# Patient Record
Sex: Female | Born: 1970 | Race: White | Hispanic: No | Marital: Married | Smoking: Former smoker
Health system: Southern US, Community
[De-identification: ages and names within clinical notes are randomized; demographics above are authoritative.]

## PROBLEM LIST (undated history)

## (undated) ENCOUNTER — Ambulatory Visit: Admission: EM

## (undated) DIAGNOSIS — F419 Anxiety disorder, unspecified: Secondary | ICD-10-CM

## (undated) HISTORY — PX: KNEE SURGERY: SHX244

## (undated) HISTORY — DX: Anxiety disorder, unspecified: F41.9

## (undated) HISTORY — PX: ABDOMINAL HYSTERECTOMY: SHX81

---

## 2013-03-17 ENCOUNTER — Ambulatory Visit: Payer: Self-pay | Admitting: Internal Medicine

## 2014-06-24 ENCOUNTER — Ambulatory Visit
Admit: 2014-06-24 | Disposition: A | Discharge status: Home / Self Care | Payer: Self-pay | Attending: Gastroenterology | Admitting: Gastroenterology

## 2014-06-24 LAB — HCG, QUANTITATIVE, PREGNANCY: Beta Hcg, Quant.: <1 m[IU]/mL

## 2014-06-30 LAB — SURGICAL PATHOLOGY

## 2014-11-26 ENCOUNTER — Emergency Department
Admission: EM | Admit: 2014-11-26 | Discharge: 2014-11-26 | Disposition: A | Discharge status: Home / Self Care | Payer: BLUE CROSS/BLUE SHIELD | Attending: Emergency Medicine | Admitting: Emergency Medicine

## 2014-11-26 ENCOUNTER — Emergency Department: Payer: BLUE CROSS/BLUE SHIELD

## 2014-11-26 DIAGNOSIS — R110 Nausea: Secondary | ICD-10-CM | POA: Diagnosis not present

## 2014-11-26 DIAGNOSIS — R1011 Right upper quadrant pain: Secondary | ICD-10-CM | POA: Diagnosis present

## 2014-11-26 DIAGNOSIS — R109 Unspecified abdominal pain: Secondary | ICD-10-CM

## 2014-11-26 LAB — URINALYSIS COMPLETE WITH MICROSCOPIC (ARMC ONLY)
BILIRUBIN URINE: NEGATIVE
GLUCOSE, UA: NEGATIVE mg/dL
Hgb urine dipstick: NEGATIVE
Ketones, ur: NEGATIVE mg/dL
Leukocytes, UA: NEGATIVE
Nitrite: NEGATIVE
PH: 5 (ref 5.0–8.0)
Protein, ur: NEGATIVE mg/dL
RBC / HPF: NONE SEEN RBC/hpf (ref 0–5)
Specific Gravity, Urine: 1.02 (ref 1.005–1.030)

## 2014-11-26 LAB — LIPASE, BLOOD: Lipase: 33 U/L (ref 22–51)

## 2014-11-26 LAB — COMPREHENSIVE METABOLIC PANEL
ALK PHOS: 72 U/L (ref 38–126)
ALT: 22 U/L (ref 14–54)
AST: 23 U/L (ref 15–41)
Albumin: 4 g/dL (ref 3.5–5.0)
Anion gap: 9 (ref 5–15)
BUN: 18 mg/dL (ref 6–20)
CHLORIDE: 108 mmol/L (ref 101–111)
CO2: 23 mmol/L (ref 22–32)
Calcium: 9 mg/dL (ref 8.9–10.3)
Creatinine, Ser: 0.76 mg/dL (ref 0.44–1.00)
GFR calc Af Amer: >60 mL/min (ref >60)
GFR calc non Af Amer: >60 mL/min (ref >60)
GLUCOSE: 83 mg/dL (ref 65–99)
Potassium: 3.8 mmol/L (ref 3.5–5.1)
Sodium: 140 mmol/L (ref 135–145)
Total Bilirubin: 0.5 mg/dL (ref 0.3–1.2)
Total Protein: 6.8 g/dL (ref 6.5–8.1)

## 2014-11-26 LAB — CBC
HCT: 39.7 % (ref 35.0–47.0)
Hemoglobin: 12.7 g/dL (ref 12.0–16.0)
MCH: 28.9 pg (ref 26.0–34.0)
MCHC: 32 g/dL (ref 32.0–36.0)
MCV: 90.3 fL (ref 80.0–100.0)
Platelets: 219 10*3/uL (ref 150–440)
RBC: 4.39 MIL/uL (ref 3.80–5.20)
RDW: 13.4 % (ref 11.5–14.5)
WBC: 12.1 10*3/uL — ABNORMAL HIGH (ref 3.6–11.0)

## 2014-11-26 MED ORDER — SODIUM CHLORIDE 0.9 % IV BOLUS (SEPSIS)
1000.0000 mL | Freq: Once | INTRAVENOUS | Status: AC
  Administered 2014-11-26: 1000 mL via INTRAVENOUS

## 2014-11-26 MED ORDER — ONDANSETRON HCL 4 MG/2ML IJ SOLN
4.0000 mg | Freq: Once | INTRAMUSCULAR | Status: AC
  Administered 2014-11-26: 4 mg via INTRAVENOUS

## 2014-11-26 MED ORDER — MORPHINE SULFATE (PF) 4 MG/ML IV SOLN
4.0000 mg | Freq: Once | INTRAVENOUS | Status: AC
  Administered 2014-11-26: 4 mg via INTRAVENOUS
  Filled 2014-11-26: qty 1

## 2014-11-26 MED ORDER — OXYCODONE-ACETAMINOPHEN 5-325 MG PO TABS: 1.0000 | ORAL_TABLET | Freq: Four times a day (QID) | ORAL | Status: AC | PRN

## 2014-11-26 MED ORDER — MORPHINE SULFATE (PF) 4 MG/ML IV SOLN
INTRAVENOUS | Status: AC
  Administered 2014-11-26: 4 mg via INTRAVENOUS
  Filled 2014-11-26: qty 1

## 2014-11-26 MED ORDER — ONDANSETRON HCL 4 MG PO TABS: 4.0000 mg | ORAL_TABLET | Freq: Every day | ORAL | Status: AC | PRN

## 2014-11-26 MED ORDER — MORPHINE SULFATE (PF) 4 MG/ML IV SOLN
4.0000 mg | Freq: Once | INTRAVENOUS | Status: AC
  Administered 2014-11-26: 4 mg via INTRAVENOUS

## 2014-11-26 MED ORDER — ONDANSETRON HCL 4 MG/2ML IJ SOLN
INTRAMUSCULAR | Status: AC
  Administered 2014-11-26: 4 mg via INTRAVENOUS
  Filled 2014-11-26: qty 2

## 2014-11-26 NOTE — Discharge Instructions (Signed)
Flank Pain °Flank pain refers to pain that is located on the side of the body between the upper abdomen and the back. The pain may occur over a short period of time (acute) or may be long-term or reoccurring (chronic). It may be mild or severe. Flank pain can be caused by many things. °CAUSES  °Some of the more common causes of flank pain include: °· Muscle strains.   °· Muscle spasms.   °· A disease of your spine (vertebral disk disease).   °· A lung infection (pneumonia).   °· Fluid around your lungs (pulmonary edema).   °· A kidney infection.   °· Kidney stones.   °· A very painful skin rash caused by the chickenpox virus (shingles).   °· Gallbladder disease.   °HOME CARE INSTRUCTIONS  °Home care will depend on the cause of your pain. In general, °· Rest as directed by your caregiver. °· Drink enough fluids to keep your urine clear or pale yellow. °· Only take over-the-counter or prescription medicines as directed by your caregiver. Some medicines may help relieve the pain. °· Tell your caregiver about any changes in your pain. °· Follow up with your caregiver as directed. °SEEK IMMEDIATE MEDICAL CARE IF:  °· Your pain is not controlled with medicine.   °· You have new or worsening symptoms. °· Your pain increases.   °· You have abdominal pain.   °· You have shortness of breath.   °· You have persistent nausea or vomiting.   °· You have swelling in your abdomen.   °· You feel faint or pass out.   °· You have blood in your urine. °· You have a fever or persistent symptoms for more than 2-3 days. °· You have a fever and your symptoms suddenly get worse. °MAKE SURE YOU:  °· Understand these instructions. °· Will watch your condition. °· Will get help right away if you are not doing well or get worse. °Document Released: 04/14/2005 Document Revised: 11/16/2011 Document Reviewed: 10/06/2011 °ExitCare® Patient Information ©2015 [employer]. This information is not intended to replace advice given to you by your  health care provider. Make sure you discuss any questions you have with your health care provider. ° °

## 2014-11-26 NOTE — ED Provider Notes (Signed)
[facility] Emergency Department Provider Note  ____________________________________________  Time seen: Approximately 638 AM  I have reviewed the triage vital signs and the nursing notes.   HISTORY  Chief Complaint Abdominal Pain    HPI Tresa Jolley Stump is a 44 y.o. female who comes into the hospital with severe abdominal pain. The patient reports that she woke up with RUQ pain with radiation to her back. The patient reports that she had radiation to her back from her right upper quadrant and some right sided shoulder pain. The patient reports that she had a similar pain 1 month ago but it did not last very long. The patient denies any fever. She has had some chest pain but feels that it is more in her shoulder. The patient denies any other pain or any problems with urination. Her pain is 10/10 in intensity   No past medical history   There are no active problems to display for this patient.   past surgical history  BTL Knee surgery  No current outpatient prescriptions on file.  Allergies Review of patient's allergies indicates no known allergies.  No family history on file.  Social History Social History  Substance Use Topics  . Smoking status: Not on file  . Smokeless tobacco: Not on file  . Alcohol Use: Not on file    Review of Systems Constitutional: No fever/chills Eyes: No visual changes. ENT: No sore throat. Cardiovascular: Denies chest pain. Respiratory: Denies shortness of breath. Gastrointestinal:  abdominal pain and nausea, no vomiting.  No constipation. Genitourinary: Negative for dysuria. Musculoskeletal: Negative for back pain. Skin: Negative for rash. Neurological: Negative for headaches, focal weakness or numbness.  10-point ROS otherwise negative.  ____________________________________________   PHYSICAL EXAM:  VITAL SIGNS: ED Triage Vitals  Enc Vitals Group     BP 11/26/14 0622 134/83 mmHg     Pulse Rate 11/26/14  0622 92     Resp 11/26/14 0622 18     Temp 11/26/14 0622 97.5 F (36.4 C)     Temp Source 11/26/14 0622 Oral     SpO2 11/26/14 0622 100 %     Weight 11/26/14 0622 160 lb (72.576 kg)     Height 11/26/14 0622  (1.626 m)     Head Cir --      Peak Flow --      Pain Score 11/26/14 0622 10     Pain Loc --      Pain Edu? --      Excl. in GC? --     Constitutional: Alert and oriented. Well appearing and in moderate distress. Eyes: Conjunctivae are normal. PERRL. EOMI. Head: Atraumatic. Nose: No congestion/rhinnorhea. Mouth/Throat: Mucous membranes are moist.  Oropharynx non-erythematous. Cardiovascular: Normal rate, regular rhythm. Grossly normal heart sounds.  Good peripheral circulation. Respiratory: Normal respiratory effort.  No retractions. Lungs CTAB. Gastrointestinal: Soft with right upper quadrant tenderness to palpation. No distention. Positive bowel sounds Musculoskeletal: No lower extremity tenderness nor edema.  Neurologic:  Normal speech and language.  Skin:  Skin is warm, dry and intact.  Psychiatric: Mood and affect are normal.   ____________________________________________   LABS (all labs ordered are listed, but only abnormal results are displayed)  Labs Reviewed - No data to display ____________________________________________  EKG  ED ECG REPORT I, Rebecka Apley, the attending physician, personally viewed and interpreted this ECG.   Date: 11/26/2014  EKG Time: 636  Rate: 86  Rhythm: normal sinus rhythm  Axis: normal  Intervals:none  ST&T Change: none  ____________________________________________  RADIOLOGY  [place] Abd ____________________________________________   PROCEDURES  Procedure(s) performed: None  Critical Care performed: No  ____________________________________________   INITIAL IMPRESSION / ASSESSMENT AND PLAN / ED COURSE  Pertinent labs & imaging results that were available during my care of the patient were reviewed by  me and considered in my medical decision making (see chart for details).  This is a 44 year old female who woke up with acute onset of right upper quadrant pain with radiation to her right shoulder. The patient is nauseous with no vomiting but reports her pain as a 10 out of 10. I'll give the patient liter of normal saline, some morphine and Zofran and continue to reassess her. I will await the results of the ultrasound to determine the cause of the patient's pain.  The patient's care will be signed out to Dr. Mayford Knife will follow-up the results of the ultrasound to determine the patient's cause of pain. ____________________________________________   FINAL CLINICAL IMPRESSION(S) / ED DIAGNOSES  Final diagnoses:  RUQ pain      Rebecka Apley, MD 11/26/14 0840

## 2014-11-26 NOTE — ED Provider Notes (Signed)
Patient is a normal ultrasound but persistent right flank pain. We'll obtain renal protocol CT give additional IV morphine for pain.  Emily Filbert, MD 11/26/14 [phone]

## 2014-11-26 NOTE — ED Provider Notes (Signed)
  IMPRESSION: Mild right renal pelviectasis without frank hydronephrosis. No obstructing renal or ureteral stone. This may be due to a recently passed stone. Another possibility would be cystitis with ascending ureteral infection. Recommend correlation with urinalysis to help distinguish between these 2 possibilities.  Remainder of the abdomen and pelvis CT is unremarkable. No evidence of appendicitis. No significant free fluid. No bowel obstruction or evidence of bowel wall inflammation. Fairly large amount of stool throughout the nondistended colon (constipation?).   possible recently passed kidney stone. We'll send urine culture as well although the urine looks unremarkable. She'll be discharged with pain medicine and encouraged close follow-up with her doctor.  Emily Filbert, MD 11/26/14 1005

## 2014-11-26 NOTE — ED Notes (Signed)
Pt in with right sided abd pain, denies any n,v,d.

## 2014-11-28 LAB — URINE CULTURE
Culture: 40000
Special Requests: NORMAL

## 2017-05-18 ENCOUNTER — Ambulatory Visit: Payer: BLUE CROSS/BLUE SHIELD | Admitting: Physical Therapy

## 2017-05-22 ENCOUNTER — Encounter: Payer: Self-pay | Admitting: Physical Therapy

## 2017-05-22 ENCOUNTER — Other Ambulatory Visit: Payer: Self-pay

## 2017-05-22 ENCOUNTER — Ambulatory Visit: Payer: BLUE CROSS/BLUE SHIELD | Attending: Gastroenterology | Admitting: Physical Therapy

## 2017-05-22 DIAGNOSIS — R29898 Other symptoms and signs involving the musculoskeletal system: Secondary | ICD-10-CM | POA: Diagnosis present

## 2017-05-22 DIAGNOSIS — R2689 Other abnormalities of gait and mobility: Secondary | ICD-10-CM | POA: Diagnosis present

## 2017-05-22 DIAGNOSIS — M791 Myalgia, unspecified site: Secondary | ICD-10-CM | POA: Diagnosis not present

## 2017-05-22 DIAGNOSIS — M6281 Muscle weakness (generalized): Secondary | ICD-10-CM | POA: Diagnosis not present

## 2017-05-23 NOTE — Therapy (Addendum)
[facility] MAIN [facility] SERVICES [address] [place], [place], [address] Phone: [phone]   Fax:  [phone]  Physical Therapy Evaluation  Patient Details  Name: Nancy Paul MRN: [id number] Date of Birth: [birth year] Referring Provider: Trellis Paganini, PA   Encounter Date: 05/22/2017    Past Medical History:  Diagnosis Date  . Anxiety     History reviewed. No pertinent surgical history.  There were no vitals filed for this visit.   Subjective Assessment - 05/22/17 1511    Subjective  1)bowel issues:  Pt has had constipation for over 2 years. Bowel movements occur once a week. [place] Stool Type 3-4 occur 75%, Type 6-7 occur about in spells. Pt has been Dx with IBS 10 years ago after the delivery of her second child with a perineal tear "from hole to  hole". First her first child, she had small tear.  Denied constipation during childhood.  Fecal incontinence occurs after being constipated.   Pt feels she has not completely emptied and has tried enemas as recommended by her GI provider and she has discovered there is alot left everytime.  Pt has not used an enema for a couple weeks.  Pt reports she has to strain.  Pt reports she has alot of anxiety around this issue.  Pt has soiled her undergarments but she does not wear pads.    2) urinary leakage: pt notices dribbling after she stops urination. Pt has tried kegels midstream because she was told to strengthen her muscles after she had her hsysterectomy. Pt no longer does these exercises but she did find it to help.  Denied urge incontinence.  The leakage occurs after urination and not prior to getting there    3) CLBP occurred after her hysterectomy 2 years due to endometriosis, fibroids, and precancerous cells. Pt does not live in pain now. Pt had an emergency surgery 8 weeks after her hysterectomy because she had excuriating pain. She was told that the surgerical site did not close completely  around her cervix which caused her intestines to come through. The surgery resolve pain.  CLBP is achey pain in the lower back, without radiating pain.  Pt tried to start exercising a few weeks with the elliptical machine but had increased back pain. Pt has not gone back tot he gym since.     4) pelvic and lowback pain after sexual intercourse and with pelvic exams.    5)  sometimes numbness in both arms L > R upon waking since 2018-current.  Tenderness to touch on upper arms with welty type markings all year around. These are areas where she feels numb.     Pertinent History  Pt stopped taking her anxiety medication 2 months ago. Pt has not let her MD know but will be telling her on 06/01/17 at her physical appt. Stressors: gaining 32 lbs after hysterectomy,  Pt has worked with her PCP Dr. Shayne Alken for weight loss.  Pt used to work out 3-4x week, cardio, weights. Pt has stopped doing sti ups and crunches since hysterectomy.       Patient Stated Goals  Pt would like to feel good, regain energy level, and live happy, and be in shape again with a stronger core.          [facility] PT Assessment - 06/01/17 1714      Assessment   Medical Diagnosis  fecal incontinence    Referring Provider  Trellis Paganini, PA  Precautions   Precautions  None      Restrictions   Weight Bearing Restrictions  No      Balance Screen   Has the patient fallen in the past 6 months  Yes    How many times?  1    Has the patient had a decrease in activity level because of a fear of falling?   No    Is the patient reluctant to leave their home because of a fear of falling?   No      Observation/Other Assessments   Observations  crossed ankles in seated position       Coordination   Coordination and Movement Description  ab straining, cue for BM, pelvic floor contraction       Posture/Postural Control   Posture Comments  lumbopelvic perturbations w/ leg movements .       AROM   Overall AROM Comments  LBP reproduced w/  report of tightness in trunk rotaion full range       Palpation   Spinal mobility  increased upper trap/ thoracic hypomobility     Palpation comment  uper and lower abdomen all quadrants: non tender       Bed Mobility   Bed Mobility  -- crunching method, ab/ spine straining         Pelvic Floor Special Questions - 06/01/17 1715    External Perineal Exam  tenderness. tightness noted at upper and lower pelvic floor mm , palpation through clothing      Treatment: see pt instructions            PT Long Term Goals - 06/01/17 1711      PT LONG TERM GOAL #1   Title  Pt will decrease her COREFO score from 28% to <23 % in order to improve bowel movements and to not strain    Time  12    Period  Weeks    Status  New      PT LONG TERM GOAL #2   Title  Pt will decrease her PDI score from 31% to <26 %  in order to improve QOL     Time  8    Period  Weeks    Status  New      PT LONG TERM GOAL #3   Title  Pt will report decreased numbness in B arms by 50% and demo decreased upper trap mm tightness in order to progress to deep core coordination for improved function for ADLs    Time  6    Period  Weeks    Status  New      PT LONG TERM GOAL #4   Title  Pt will demo softer abdomen with palpation, improved mobility of deep core in order to progress to stability exercises for improved GI and pelvic floor function     Time  4    Period  Weeks    Status  New      PT LONG TERM GOAL #5   Title  Pt will decrease CLBP by 50% in order to progress towards fitness exercises     Time  6    Period  Weeks    Status  New      PT LONG TERM GOAL #6   Title  Pt will demo proper technique and alignment and voice understanding on principles of exercises in order to intergrate to gym setting with less risk for injuries    Time  12  Period  Weeks        Plan - 05/23/17 2254    Clinical Impression Statement  Pt is a 47 yo female who reports constipation,post-urine dribble, CLBP, and B  arm/hand numbness. These deficits impact her QOL and ADLs. Pt's clinical presentations include non-soft abdomen, increased thoracic hypomobility, increased upper neck/ shoulder, pelvic floor tightness, dyscoordination of her deep core mm, and poor body mechanics that place strain on her deep core system. Pt has had multiple surgeries over her abdomen including a hysterectomy 2 years due to endometriosis, fibroids, and precancerous cells and later, an emergency surgery 8 weeks after her hysterectomy due the surgerical site failing to close completely around her cervix.  Pt also had a perineal tears with both her deliveries. These are contributing factors to her Sx. Following Tx today, pt demo'd improved breathing coordination to not strain her pelvic floor. Plan to assess her pelvic floor and arms/shoulder  at upcoming sessions.    Rehab Potential  Good    PT Frequency  1x / week    PT Duration  12 weeks    PT Treatment/Interventions  Scar mobilization;Neuromuscular re-education;Balance training;Therapeutic exercise;Therapeutic activities;Manual lymph drainage;Patient/family education;Moist Heat;Traction;Manual techniques;Gait training;Taping;Energy conservation;Functional mobility training;Stair training    Consulted and Agree with Plan of Care  Patient       Patient will benefit from skilled therapeutic intervention in order to improve the following deficits and impairments:  Postural dysfunction, Increased muscle spasms, Hypermobility, Decreased scar mobility, Decreased mobility, Decreased coordination, Decreased endurance, Decreased activity tolerance, Decreased range of motion, Decreased strength, Hypomobility, Decreased safety awareness, Improper body mechanics, Pain, Increased fascial restricitons  Visit Diagnosis: Muscle weakness (generalized)  Other symptoms and signs involving the musculoskeletal system  Other abnormalities of gait and mobility     Problem List There are no active  problems to display for this patient.   Mariane MastersYeung,Shin Yiing ,PT, DPT, E-RYT  06/01/2017, 5:16 PM  [place] [facility] [address] [place], [place], [address] Phone: [phone]   Fax:  [phone]  Name: Nancy Paul MRN: [id number] Date of Birth: [birth year]

## 2017-06-01 ENCOUNTER — Ambulatory Visit: Payer: BLUE CROSS/BLUE SHIELD | Admitting: Physical Therapy

## 2017-06-01 DIAGNOSIS — R29898 Other symptoms and signs involving the musculoskeletal system: Secondary | ICD-10-CM

## 2017-06-01 DIAGNOSIS — M6281 Muscle weakness (generalized): Secondary | ICD-10-CM

## 2017-06-01 DIAGNOSIS — R2689 Other abnormalities of gait and mobility: Secondary | ICD-10-CM

## 2017-06-01 NOTE — Addendum Note (Signed)
Addended by: Mariane MastersYEUNG, SHIN-YIING on: 06/01/2017 05:22 PM   Modules accepted: Orders

## 2017-06-01 NOTE — Patient Instructions (Signed)
Open book exercise ( handout)   Gentle ROM at neck, shoulders, pelvic tilts, ankles   Deep core level 2    Restorative pose ( legs propped up ) after work 10-15 min  with towel over eyes, to calm down the senses. Relaxation to help with nervous system which will help with bowel and pelvic function

## 2017-06-01 NOTE — Therapy (Signed)
[place] [facility] [address] [place], [place], [address] Phone: [phone]   Fax:  [phone]  Physical Therapy Treatment  Patient Details  Name: Nancy Paul MRN: [id number] Date of Birth: 1970-03-24 Referring Provider: Trellis Paganinianielle Maier, PA   Encounter Date: 06/01/2017  PT End of Session - 06/01/17 1729    Visit Number  2    Number of Visits  12    Date for PT Re-Evaluation  08/15/17    PT Start Time  0810    PT Stop Time  0910    PT Time Calculation (min)  60 min       Past Medical History:  Diagnosis Date  . Anxiety     No past surgical history on file.  There were no vitals filed for this visit.  Subjective Assessment - 06/01/17 1727    Subjective  Pt reports she has been doing her exercises everyday    Pertinent History  Pt stopped taking her anxiety medication 2 months ago. Pt has not let her MD know but will be telling her on 06/01/17 at her physical appt. Stressors: gaining 32 lbs after hysterectomy,  Pt has worked with her PCP Dr. Shayne AlkenLam for weight loss.  Pt used to work out 3-4x week, cardio, weights. Pt has stopped doing sti ups and crunches since hysterectomy.       Patient Stated Goals  Pt would like to feel good, regain energy level, and live happy, and be in shape again with a stronger core.          [facility] - 06/01/17 1731      Palpation   Spinal mobility  increased mm tensions at medial scap, levator, upper trap   R > L, hypobility at thoracic spine.                [facility] Adult PT Treatment/Exercise - 06/01/17 1731      Neuro Re-ed    Neuro Re-ed Details   see pt instructions       Modalities   Modalities  Moist Heat      Moist Heat Therapy   Number Minutes Moist Heat  5 Minutes    Moist Heat Location  -- neck, shoulders, back       Manual Therapy   Manual therapy comments  STM at problem areas, Grade III at thoracic mobilityT3-12 , gentle abdominal massage                PT Education - 06/01/17 1729    Education provided  Yes    Education Details  HEP    Person(s) Educated  Patient    Methods  Explanation;Demonstration;Tactile cues;Verbal cues;Handout    Comprehension  Returned demonstration;Verbalized understanding          PT Long Term Goals - 06/01/17 1711      PT LONG TERM GOAL #1   Title  Pt will decrease her COREFO score from 28% to <23 % in order to improve bowel movements and to not strain    Time  12    Period  Weeks    Status  New      PT LONG TERM GOAL #2   Title  Pt will decrease her PDI score from 31% to <26 %  in order to improve QOL     Time  8    Period  Weeks    Status  New      PT LONG  TERM GOAL #3   Title  Pt will report decreased numbness in B arms by 50% and demo decreased upper trap mm tightness in order to progress to deep core coordination for improved function for ADLs    Time  6    Period  Weeks    Status  New      PT LONG TERM GOAL #4   Title  Pt will demo softer abdomen with palpation, improved mobility of deep core in order to progress to stability exercises for improved GI and pelvic floor function     Time  4    Period  Weeks    Status  New      PT LONG TERM GOAL #5   Title  Pt will decrease CLBP by 50% in order to progress towards fitness exercises     Time  6    Period  Weeks    Status  New      PT LONG TERM GOAL #6   Title  Pt will demo proper technique and alignment and voice understanding on principles of exercises in order to intergrate to gym setting with less risk for injuries    Time  12    Period  Weeks            Plan - 06/01/17 1729    Clinical Impression Statement  Pt showed decreased thoracic/ shoulder mm tensions postTx. Pt demo'd improved diaphragmatic excursion and pelvic floor relaxation. Pt was guided with relaxation practices and thoracic mobility HEP. Pt reported feeling lighter and appeared to be holding less tensions in her shoulders post Tx.  Pt continues  to benefit from skilled PT.    Rehab Potential  Good    PT Frequency  1x / week    PT Duration  12 weeks    PT Treatment/Interventions  Scar mobilization;Neuromuscular re-education;Balance training;Therapeutic exercise;Therapeutic activities;Manual lymph drainage;Patient/family education;Moist Heat;Traction;Manual techniques;Gait training;Taping;Energy conservation;Functional mobility training;Stair training    Consulted and Agree with Plan of Care  Patient       Patient will benefit from skilled therapeutic intervention in order to improve the following deficits and impairments:  Postural dysfunction, Increased muscle spasms, Hypermobility, Decreased scar mobility, Decreased mobility, Decreased coordination, Decreased endurance, Decreased activity tolerance, Decreased range of motion, Decreased strength, Hypomobility, Decreased safety awareness, Improper body mechanics, Pain, Increased fascial restricitons  Visit Diagnosis: Muscle weakness (generalized)  Other symptoms and signs involving the musculoskeletal system  Other abnormalities of gait and mobility     Problem List There are no active problems to display for this patient.   Mariane Masters ,PT, DPT, E-RYT  06/01/2017, 5:34 PM  [place] MAIN [facility] SERVICES [address] [place], [place], [address] Phone: [phone]   Fax:  [phone]  Name: Nancy Paul MRN: [id number] Date of Birth: [birth year]

## 2017-06-01 NOTE — Patient Instructions (Signed)
Proper breathing and toileting posture

## 2017-06-05 ENCOUNTER — Ambulatory Visit: Payer: BLUE CROSS/BLUE SHIELD | Admitting: Physical Therapy

## 2017-06-14 ENCOUNTER — Ambulatory Visit: Payer: BLUE CROSS/BLUE SHIELD | Attending: Gastroenterology | Admitting: Physical Therapy

## 2017-06-14 DIAGNOSIS — M6281 Muscle weakness (generalized): Secondary | ICD-10-CM | POA: Diagnosis not present

## 2017-06-14 DIAGNOSIS — R279 Unspecified lack of coordination: Secondary | ICD-10-CM | POA: Diagnosis present

## 2017-06-14 DIAGNOSIS — R29898 Other symptoms and signs involving the musculoskeletal system: Secondary | ICD-10-CM | POA: Insufficient documentation

## 2017-06-14 DIAGNOSIS — R2689 Other abnormalities of gait and mobility: Secondary | ICD-10-CM | POA: Diagnosis present

## 2017-06-14 NOTE — Therapy (Signed)
[facility] MAIN [facility] SERVICES [address] [place], [place], [address] Phone: [phone]   Fax:  [phone]  Physical Therapy Treatment  Patient Details  Name: Nancy Paul MRN: [id number] Date of Birth: 07-22-1970 Referring Provider: Trellis Paganini, PA   Encounter Date: 06/14/2017  PT End of Session - 06/14/17 1707    Visit Number  3    Number of Visits  12    Date for PT Re-Evaluation  08/15/17    PT Start Time  1600    PT Stop Time  1705    PT Time Calculation (min)  65 min       Past Medical History:  Diagnosis Date  . Anxiety     No past surgical history on file.  There were no vitals filed for this visit.  Subjective Assessment - 06/14/17 1603    Subjective  Pt has been doing her exercises.     Pertinent History  Pt stopped taking her anxiety medication 2 months ago. Pt has not let her MD know but will be telling her on 06/01/17 at her physical appt. Stressors: gaining 32 lbs after hysterectomy,  Pt has worked with her PCP Dr. Shayne Alken for weight loss.  Pt used to work out 3-4x week, cardio, weights. Pt has stopped doing sti ups and crunches since hysterectomy.       Patient Stated Goals  Pt would like to feel good, regain energy level, and live happy, and be in shape again with a stronger core.                     Pelvic Floor Special Questions - 06/14/17 1659    External Perineal Exam  tenderness/tightness R > L at coccyx/ bulbospongiosus, ischiocavernosus     Pelvic Floor Internal Exam  pt consented verbally without contraindications     Exam Type  Vaginal    Palpation  increased severely restricted perineal scar in 3rd layer 3 - 9 o'clock / iscial spine and obt int B          [facility] Adult PT Treatment/Exercise - 06/14/17 1703      Exercises   Exercises  -- see pt instructions       Manual Therapy   Manual therapy comments  MWM, gentle STM at probelm areas noted in assessment     Internal Pelvic Floor    3rd layer 3 - 9 o'clock , scar releases with MWM at distal ends             PT Education - 06/14/17 1707    Education provided  Yes    Education Details  HEP    Person(s) Educated  Patient    Methods  Explanation;Demonstration;Tactile cues;Verbal cues;Handout    Comprehension  Returned demonstration;Verbalized understanding          PT Long Term Goals - 06/01/17 1711      PT LONG TERM GOAL #1   Title  Pt will decrease her COREFO score from 28% to <23 % in order to improve bowel movements and to not strain    Time  12    Period  Weeks    Status  New      PT LONG TERM GOAL #2   Title  Pt will decrease her PDI score from 31% to <26 %  in order to improve QOL     Time  8    Period  Weeks    Status  New      PT LONG TERM GOAL #3   Title  Pt will report decreased numbness in B arms by 50% and demo decreased upper trap mm tightness in order to progress to deep core coordination for improved function for ADLs    Time  6    Period  Weeks    Status  New      PT LONG TERM GOAL #4   Title  Pt will demo softer abdomen with palpation, improved mobility of deep core in order to progress to stability exercises for improved GI and pelvic floor function     Time  4    Period  Weeks    Status  New      PT LONG TERM GOAL #5   Title  Pt will decrease CLBP by 50% in order to progress towards fitness exercises     Time  6    Period  Weeks    Status  New      PT LONG TERM GOAL #6   Title  Pt will demo proper technique and alignment and voice understanding on principles of exercises in order to intergrate to gym setting with less risk for injuries    Time  12    Period  Weeks            Plan - 06/14/17 1707    Clinical Impression Statement  Pt demo'd increased perineal scar restrictions in the 3rd layer of her pelvic floor mm.  Pt demo'd less scar restrictions at the distal ends of her scar post Tx. Pt also showed less mm tightness at superficial layers. Pt continues to  benefit from skilled PT.     Rehab Potential  Good    PT Frequency  1x / week    PT Duration  12 weeks    PT Treatment/Interventions  Scar mobilization;Neuromuscular re-education;Balance training;Therapeutic exercise;Therapeutic activities;Manual lymph drainage;Patient/family education;Moist Heat;Traction;Manual techniques;Gait training;Taping;Energy conservation;Functional mobility training;Stair training    Consulted and Agree with Plan of Care  Patient       Patient will benefit from skilled therapeutic intervention in order to improve the following deficits and impairments:  Postural dysfunction, Increased muscle spasms, Hypermobility, Decreased scar mobility, Decreased mobility, Decreased coordination, Decreased endurance, Decreased activity tolerance, Decreased range of motion, Decreased strength, Hypomobility, Decreased safety awareness, Improper body mechanics, Pain, Increased fascial restricitons  Visit Diagnosis: Muscle weakness (generalized)  Other abnormalities of gait and mobility  Other symptoms and signs involving the musculoskeletal system     Problem List There are no active problems to display for this patient.   Mariane MastersYeung,Shin Yiing ,PT, DPT, E-RYT  06/14/2017, 5:11 PM  [facility] [address] [place], [place], [address] Phone: [phone]   Fax:  [phone]  Name: Nancy Paul MRN: [id number] Date of Birth: 05-Nov-1970

## 2017-06-14 NOTE — Patient Instructions (Signed)
1) Pelvic tilts 10reps   2) Stretch for pelvic floor   "v heels slide away and then back toward buttocks and then rock knee to slight ,  slide heel along at 11 o clock away from buttocks   10 reps   3) happy baby with breathing and pelvic expansion awareness 5 breaths     4) child poses rocking    _____  At work:  Mini squats with hands motioning expansion of pelvic floor  10 reps   childs pose at the desk - 3 way   5 breaths    minisquat with -ankle over opposite , lengthen your back

## 2017-06-23 ENCOUNTER — Ambulatory Visit: Payer: BLUE CROSS/BLUE SHIELD | Admitting: Physical Therapy

## 2017-06-23 DIAGNOSIS — M6281 Muscle weakness (generalized): Secondary | ICD-10-CM | POA: Diagnosis not present

## 2017-06-23 DIAGNOSIS — R279 Unspecified lack of coordination: Secondary | ICD-10-CM

## 2017-06-23 DIAGNOSIS — R2689 Other abnormalities of gait and mobility: Secondary | ICD-10-CM

## 2017-06-23 DIAGNOSIS — R29898 Other symptoms and signs involving the musculoskeletal system: Secondary | ICD-10-CM

## 2017-06-23 NOTE — Patient Instructions (Signed)
   Standing:  10 reps on both sides x 3 x day     3 point tap   Feet are hip width Tap forward, center under hip not feet next to each other  Tap middle\, center  Tap back       _Figure-4 and then toe touch to the ground behind you along a diagonal   

## 2017-06-23 NOTE — Therapy (Signed)
[facility] MAIN [facility] SERVICES [address] [place], [place], [address] Phone: [phone]   Fax:  [phone]  Physical Therapy Treatment  Patient Details  Name: MONAE TOPPING MRN: [id number] Date of Birth: 09/03/1970 Referring Provider: Trellis Paganini, PA   Encounter Date: 06/23/2017  PT End of Session - 06/23/17 1248    Visit Number  4    Number of Visits  12    Date for PT Re-Evaluation  08/15/17    PT Start Time  0915    PT Stop Time  1005    PT Time Calculation (min)  50 min       Past Medical History:  Diagnosis Date  . Anxiety     No past surgical history on file.  There were no vitals filed for this visit.  Subjective Assessment - 06/23/17 0915    Subjective  Pt reported she had increased back pain after last session but after taking Alleve, it went away.     Pertinent History  Pt stopped taking her anxiety medication 2 months ago. Pt has not let her MD know but will be telling her on 06/01/17 at her physical appt. Stressors: gaining 32 lbs after hysterectomy,  Pt has worked with her PCP Dr. Shayne Alken for weight loss.  Pt used to work out 3-4x week, cardio, weights. Pt has stopped doing sti ups and crunches since hysterectomy.       Patient Stated Goals  Pt would like to feel good, regain energy level, and live happy, and be in shape again with a stronger core.                     Pelvic Floor Special Questions - 06/23/17 1006    External Perineal Exam  tenderness/tightness R > L ischioanal fossa with increased tenderness/ tightness      Pelvic Floor Internal Exam  pt consented verbally without contraindications     Exam Type  Vaginal    Palpation  increased severely restricted perineal scar in 3nd layer 4- 6 oclock, 7 o clock bulbospongious, ischioanal fossa                 PT Education - 06/23/17 1247    Education provided  Yes    Education Details  HEP    Person(s) Educated  Patient    Methods   Explanation;Demonstration;Tactile cues;Verbal cues;Handout    Comprehension  Returned demonstration;Verbalized understanding          PT Long Term Goals - 06/01/17 1711      PT LONG TERM GOAL #1   Title  Pt will decrease her COREFO score from 28% to <23 % in order to improve bowel movements and to not strain    Time  12    Period  Weeks    Status  New      PT LONG TERM GOAL #2   Title  Pt will decrease her PDI score from 31% to <26 %  in order to improve QOL     Time  8    Period  Weeks    Status  New      PT LONG TERM GOAL #3   Title  Pt will report decreased numbness in B arms by 50% and demo decreased upper trap mm tightness in order to progress to deep core coordination for improved function for ADLs    Time  6    Period  Weeks  Status  New      PT LONG TERM GOAL #4   Title  Pt will demo softer abdomen with palpation, improved mobility of deep core in order to progress to stability exercises for improved GI and pelvic floor function     Time  4    Period  Weeks    Status  New      PT LONG TERM GOAL #5   Title  Pt will decrease CLBP by 50% in order to progress towards fitness exercises     Time  6    Period  Weeks    Status  New      PT LONG TERM GOAL #6   Title  Pt will demo proper technique and alignment and voice understanding on principles of exercises in order to intergrate to gym setting with less risk for injuries    Time  12    Period  Weeks            Plan - 06/23/17 1238    Clinical Impression Statement  Pt tolerated perineal scar releases without complaints with internal and external perineal manual Tx. Pt showed increased tightness on R side of posterior pelvic floor which decreased post Tx. Anticipate that as pt achieves increased pelvic floor mobility, pt will have improved bowel function and less pelvic pain.  Pt continues to benefit from skilled PT.     Rehab Potential  Good    PT Frequency  1x / week    PT Duration  12 weeks    PT  Treatment/Interventions  Scar mobilization;Neuromuscular re-education;Balance training;Therapeutic exercise;Therapeutic activities;Manual lymph drainage;Patient/family education;Moist Heat;Traction;Manual techniques;Gait training;Taping;Energy conservation;Functional mobility training;Stair training    Consulted and Agree with Plan of Care  Patient       Patient will benefit from skilled therapeutic intervention in order to improve the following deficits and impairments:  Postural dysfunction, Increased muscle spasms, Hypermobility, Decreased scar mobility, Decreased mobility, Decreased coordination, Decreased endurance, Decreased activity tolerance, Decreased range of motion, Decreased strength, Hypomobility, Decreased safety awareness, Improper body mechanics, Pain, Increased fascial restricitons  Visit Diagnosis: Muscle weakness (generalized)  Unspecified lack of coordination  Other abnormalities of gait and mobility  Other symptoms and signs involving the musculoskeletal system     Problem List There are no active problems to display for this patient.   Mariane MastersYeung,Shin Yiing ,PT, DPT, E-RYT   06/23/2017, 12:50 PM  [place] [facility] [address] [place], [place], [address] Phone: [phone]   Fax:  [phone]  Name: Burke KeelsJoyce M Gurney MRN: [id number] Date of Birth: 1971/02/28

## 2017-06-28 ENCOUNTER — Ambulatory Visit: Payer: BLUE CROSS/BLUE SHIELD | Admitting: Physical Therapy

## 2017-06-28 DIAGNOSIS — R29898 Other symptoms and signs involving the musculoskeletal system: Secondary | ICD-10-CM

## 2017-06-28 DIAGNOSIS — R2689 Other abnormalities of gait and mobility: Secondary | ICD-10-CM

## 2017-06-28 DIAGNOSIS — M6281 Muscle weakness (generalized): Secondary | ICD-10-CM | POA: Diagnosis not present

## 2017-06-28 DIAGNOSIS — R279 Unspecified lack of coordination: Secondary | ICD-10-CM

## 2017-06-28 NOTE — Therapy (Signed)
[place] [facility] [address] [place], [place], [address] Phone: [phone]   Fax:  [phone]  Physical Therapy Treatment  Patient Details  Name: Nancy Paul MRN: [id number] Date of Birth: Jul 29, 1970 Referring Provider: Trellis Paganinianielle Maier, PA   Encounter Date: 06/28/2017  PT End of Session - 06/28/17 0954    Visit Number  5    Number of Visits  12    Date for PT Re-Evaluation  08/15/17    PT Start Time  0910    PT Stop Time  0950    PT Time Calculation (min)  40 min       Past Medical History:  Diagnosis Date  . Anxiety     No past surgical history on file.  There were no vitals filed for this visit.  Subjective Assessment - 06/28/17 0911    Subjective  Pt reported she feels she is not emptying completely with bowel movement. Pt gave herself an enema 2x since the past month    Pertinent History  Pt stopped taking her anxiety medication 2 months ago. Pt has not let her MD know but will be telling her on 06/01/17 at her physical appt. Stressors: gaining 32 lbs after hysterectomy,  Pt has worked with her PCP Dr. Shayne Paul for weight loss.  Pt used to work out 3-4x week, cardio, weights. Pt has stopped doing sti ups and crunches since hysterectomy.       Patient Stated Goals  Pt would like to feel good, regain energy level, and live happy, and be in shape again with a stronger core.          [facility] - 06/28/17 0953      Coordination   Gross Motor Movements are Fluid and Coordinated  -- demo'd quick release of pelvic floor w/o cues                Pelvic Floor Special Questions - 06/28/17 0953    Pelvic Floor Internal Exam  pt consented verbally without contraindications     Exam Type  Rectal    Palpation  increased severely restrictions at 12 o clock at EAS to puborectalis anterior ( decreased post Tx)                      PT Long Term Goals - 06/28/17 0912      PT LONG TERM GOAL #1    Title  Pt will decrease her COREFO score from 28% to <23 % in order to improve bowel movements and to not strain    Time  12    Period  Weeks    Status  On-going      PT LONG TERM GOAL #2   Title  Pt will decrease her PDI score from 31% to <26 %  in order to improve QOL     Time  8    Period  Weeks    Status  On-going      PT LONG TERM GOAL #3   Title  Pt will report decreased numbness in B arms by 50% and demo decreased upper trap mm tightness in order to progress to deep core coordination for improved function for ADLs    Time  6    Period  Weeks    Status  On-going      PT LONG TERM GOAL #4   Title  Pt will demo softer abdomen with palpation, improved mobility of  deep core in order to progress to stability exercises for improved GI and pelvic floor function     Time  4    Period  Weeks    Status  On-going      PT LONG TERM GOAL #5   Title  Pt will decrease CLBP by 50% in order to progress towards fitness exercises     Time  6    Period  Weeks    Status  On-going      Additional Long Term Goals   Additional Long Term Goals  Yes      PT LONG TERM GOAL #6   Title  Pt will demo proper technique and alignment and voice understanding on principles of exercises in order to intergrate to gym setting with less risk for injuries    Time  12    Period  Weeks      PT LONG TERM GOAL #7   Title  Pt will decrease use of enema from 2 x month to < 1 x month in order to improve QOL    Time  8    Period  Weeks    Status  New    Target Date  08/23/17            Plan - 06/28/17 0954    Clinical Impression Statement  Addressed posterior pelvic floor tightness through intrarectal Tx. Pt demo'd pelvic tilt without difficulty and was educated on applying this tilt when sitting at work and on [employer] for better elimination.Also initiated pain science with sexual function of pelvic floor. Pt shows good carry over with quickly relaxing pelvic floor. Pt continues to benefit from skilled  PT.      Rehab Potential  Good    PT Frequency  1x / week    PT Duration  12 weeks    PT Treatment/Interventions  Scar mobilization;Neuromuscular re-education;Balance training;Therapeutic exercise;Therapeutic activities;Manual lymph drainage;Patient/family education;Moist Heat;Traction;Manual techniques;Gait training;Taping;Energy conservation;Functional mobility training;Stair training    Consulted and Agree with Plan of Care  Patient       Patient will benefit from skilled therapeutic intervention in order to improve the following deficits and impairments:  Postural dysfunction, Increased muscle spasms, Hypermobility, Decreased scar mobility, Decreased mobility, Decreased coordination, Decreased endurance, Decreased activity tolerance, Decreased range of motion, Decreased strength, Hypomobility, Decreased safety awareness, Improper body mechanics, Pain, Increased fascial restricitons  Visit Diagnosis: Unspecified lack of coordination  Muscle weakness (generalized)  Other abnormalities of gait and mobility  Other symptoms and signs involving the musculoskeletal system     Problem List There are no active problems to display for this patient.   Nancy Paul ,PT, DPT, E-RYT  06/28/2017, 9:56 AM  [facility] MAIN [facility] SERVICES [address] [place], [place], [address] Phone: [phone]   Fax:  [phone]  Name: Nancy Paul MRN: [id number] Date of Birth: 04/30/1970

## 2017-07-12 ENCOUNTER — Ambulatory Visit: Payer: BLUE CROSS/BLUE SHIELD | Attending: Gastroenterology | Admitting: Physical Therapy

## 2017-07-12 DIAGNOSIS — R2689 Other abnormalities of gait and mobility: Secondary | ICD-10-CM | POA: Insufficient documentation

## 2017-07-12 DIAGNOSIS — R29898 Other symptoms and signs involving the musculoskeletal system: Secondary | ICD-10-CM

## 2017-07-12 DIAGNOSIS — M6281 Muscle weakness (generalized): Secondary | ICD-10-CM

## 2017-07-12 DIAGNOSIS — M791 Myalgia, unspecified site: Secondary | ICD-10-CM | POA: Diagnosis not present

## 2017-07-12 NOTE — Therapy (Signed)
[facility] MAIN [facility] SERVICES [address] [place], [place], [address] Phone: [phone]   Fax:  [phone]  Physical Therapy Treatment  Patient Details  Name: Nancy Paul MRN: [id number] Date of Birth: 11/28/1970 Referring Provider: Trellis Paganini, PA   Encounter Date: 07/12/2017  PT End of Session - 07/12/17 0921    Visit Number  6    Number of Visits  12    Date for PT Re-Evaluation  08/15/17    PT Start Time  0914    PT Stop Time  1007    PT Time Calculation (min)  53 min       Past Medical History:  Diagnosis Date  . Anxiety     No past surgical history on file.  There were no vitals filed for this visit.  Subjective Assessment - 07/12/17 0917    Subjective  Pt reported that after last session, her stomach started churning and she had complete eliminating of bowels without straining. Since then, pt has had bowel movements where she still feels there is more to eliminate.  Pt's coworkers have complimented her on how she looks more relaxed. Pt does not feel she is as full in the middle of her stomach when she eats now.      Pertinent History  Pt stopped taking her anxiety medication 2 months ago. Pt has not let her MD know but will be telling her on 06/01/17 at her physical appt. Stressors: gaining 32 lbs after hysterectomy,  Pt has worked with her PCP Dr. Shayne Alken for weight loss.  Pt used to work out 3-4x week, cardio, weights. Pt has stopped doing sti ups and crunches since hysterectomy.       Patient Stated Goals  Pt would like to feel good, regain energy level, and live happy, and be in shape again with a stronger core.                     Pelvic Floor Special Questions - 07/12/17 0001    External Perineal Exam  L > R tensions and flinching tenderness at ischioanal fossa at the ischial tuberosity     Pelvic Floor Internal Exam  pt consented verbally without contraindications     Exam Type  Vaginal    Palpation   increased severely ischial spine, 2nd layer 3, 9 o clock         [facility] Adult PT Treatment/Exercise - 07/12/17 0945      Neuro Re-ed    Neuro Re-ed Details   see pt instructions       Manual Therapy   Manual therapy comments  STM at problem areas, Grade III at thoracic mobilityT3-12 , gentle abdominal massage      Internal Pelvic Floor   2nd layer 3 - 9 o'clock , scar releases with MWM at distal ends                  PT Long Term Goals - 06/28/17 0912      PT LONG TERM GOAL #1   Title  Pt will decrease her COREFO score from 28% to <23 % in order to improve bowel movements and to not strain    Time  12    Period  Weeks    Status  On-going      PT LONG TERM GOAL #2   Title  Pt will decrease her PDI score from 31% to <26 %  in order to improve  QOL     Time  8    Period  Weeks    Status  On-going      PT LONG TERM GOAL #3   Title  Pt will report decreased numbness in B arms by 50% and demo decreased upper trap mm tightness in order to progress to deep core coordination for improved function for ADLs    Time  6    Period  Weeks    Status  On-going      PT LONG TERM GOAL #4   Title  Pt will demo softer abdomen with palpation, improved mobility of deep core in order to progress to stability exercises for improved GI and pelvic floor function     Time  4    Period  Weeks    Status  On-going      PT LONG TERM GOAL #5   Title  Pt will decrease CLBP by 50% in order to progress towards fitness exercises     Time  6    Period  Weeks    Status  On-going      Additional Long Term Goals   Additional Long Term Goals  Yes      PT LONG TERM GOAL #6   Title  Pt will demo proper technique and alignment and voice understanding on principles of exercises in order to intergrate to gym setting with less risk for injuries    Time  12    Period  Weeks      PT LONG TERM GOAL #7   Title  Pt will decrease use of enema from 2 x month to < 1 x month in order to improve QOL    Time   8    Period  Weeks    Status  New    Target Date  08/23/17            Plan - 07/12/17 1003    Clinical Impression Statement  Pt is progressing well with Tx from past visits as she reported improved elimination of bowels but still has to strain 50% instead of 100% of the time. Today, pt showed decreased perineal scar restrictions at ischioanal fossa and 2nd layer of pelvic floor mm. Initiated lower kinetic chain co-activation in standing marches. Discussed mindful eating resources.  Pt continues to benefit from skilled PT.     Rehab Potential  Good    PT Frequency  1x / week    PT Duration  12 weeks    PT Treatment/Interventions  Scar mobilization;Neuromuscular re-education;Balance training;Therapeutic exercise;Therapeutic activities;Manual lymph drainage;Patient/family education;Moist Heat;Traction;Manual techniques;Gait training;Taping;Energy conservation;Functional mobility training;Stair training    Consulted and Agree with Plan of Care  Patient       Patient will benefit from skilled therapeutic intervention in order to improve the following deficits and impairments:  Postural dysfunction, Increased muscle spasms, Hypermobility, Decreased scar mobility, Decreased mobility, Decreased coordination, Decreased endurance, Decreased activity tolerance, Decreased range of motion, Decreased strength, Hypomobility, Decreased safety awareness, Improper body mechanics, Pain, Increased fascial restricitons  Visit Diagnosis: Muscle weakness (generalized)  Other abnormalities of gait and mobility  Other symptoms and signs involving the musculoskeletal system     Problem List There are no active problems to display for this patient.   Mariane Masters 07/12/2017, 10:07 AM  [facility] MAIN [facility] SERVICES [address] [place], [place], [address] Phone: [phone]   Fax:  [phone]  Name: Nancy Paul MRN: [id number] Date of Birth:  03/08/1970   

## 2017-07-12 NOTE — Patient Instructions (Signed)
Stretch for pelvic floor   "v heels slide away and then back toward buttocks and then rock knee to slight ,  slide heel along at 11 o clock away from buttocks   10 reps    ______  90 sec marching in place, land on ballmounds   X 3 sets with stretches   Quad, figure -4 , hip flexor, and calf

## 2017-07-20 ENCOUNTER — Encounter: Payer: BLUE CROSS/BLUE SHIELD | Admitting: Physical Therapy

## 2017-07-27 ENCOUNTER — Ambulatory Visit: Payer: BLUE CROSS/BLUE SHIELD | Admitting: Physical Therapy

## 2017-08-01 ENCOUNTER — Ambulatory Visit: Payer: BLUE CROSS/BLUE SHIELD | Admitting: Physical Therapy

## 2017-08-03 ENCOUNTER — Encounter: Payer: BLUE CROSS/BLUE SHIELD | Admitting: Physical Therapy

## 2017-08-10 ENCOUNTER — Ambulatory Visit: Payer: BLUE CROSS/BLUE SHIELD | Attending: Gastroenterology | Admitting: Physical Therapy

## 2017-08-10 DIAGNOSIS — R279 Unspecified lack of coordination: Secondary | ICD-10-CM | POA: Insufficient documentation

## 2017-08-10 DIAGNOSIS — M6281 Muscle weakness (generalized): Secondary | ICD-10-CM | POA: Insufficient documentation

## 2017-08-10 DIAGNOSIS — R2689 Other abnormalities of gait and mobility: Secondary | ICD-10-CM | POA: Insufficient documentation

## 2017-08-10 DIAGNOSIS — R29898 Other symptoms and signs involving the musculoskeletal system: Secondary | ICD-10-CM | POA: Insufficient documentation

## 2017-08-16 ENCOUNTER — Ambulatory Visit: Payer: BLUE CROSS/BLUE SHIELD | Admitting: Physical Therapy

## 2017-08-17 ENCOUNTER — Ambulatory Visit: Payer: BLUE CROSS/BLUE SHIELD | Admitting: Physical Therapy

## 2017-08-17 DIAGNOSIS — R279 Unspecified lack of coordination: Secondary | ICD-10-CM

## 2017-08-17 DIAGNOSIS — R29898 Other symptoms and signs involving the musculoskeletal system: Secondary | ICD-10-CM

## 2017-08-17 DIAGNOSIS — R2689 Other abnormalities of gait and mobility: Secondary | ICD-10-CM

## 2017-08-17 DIAGNOSIS — M6281 Muscle weakness (generalized): Secondary | ICD-10-CM | POA: Diagnosis present

## 2017-08-17 NOTE — Patient Instructions (Signed)
Restorative yoga  10 min    Reclined with knees out ( pillows under knees)  Or against wall/ bed post

## 2017-08-18 NOTE — Therapy (Signed)
[place] MAIN [facility] SERVICES [address] [place], [place], [address] Phone: [phone]   Fax:  [phone]  Physical Therapy Treatment [facility] Note  Patient Details  Name: Nancy Paul MRN: [id number] Date of Birth: 1970-10-08 Referring Provider: Josephina Gip, PA   Encounter Date: 08/17/2017  PT End of Session - 08/17/17 1407    Visit Number  7    Number of Visits  12    Date for PT Re-Evaluation  08/15/17    PT Start Time  1310    PT Stop Time  1407    PT Time Calculation (min)  57 min    Activity Tolerance  Patient tolerated treatment well;No increased pain    Behavior During Therapy  WFL for tasks assessed/performed       Past Medical History:  Diagnosis Date  . Anxiety     No past surgical history on file.  There were no vitals filed for this visit.  Subjective Assessment - 08/17/17 1313    Subjective  Pt reported her back does not hurt as much . Pt is still having trouble with the leakage but not as much.  Pt has not been doing as much of her PT exerices because of moving.     Pertinent History  Pt stopped taking her anxiety medication 2 months ago. Pt has not let her MD know but will be telling her on 06/01/17 at her physical appt. Stressors: gaining 32 lbs after hysterectomy,  Pt has worked with her PCP Dr. Chauncy Passy for weight loss.  Pt used to work out 3-4x week, cardio, weights. Pt has stopped doing sti ups and crunches since hysterectomy.       Patient Stated Goals  Pt would like to feel good, regain energy level, and live happy, and be in shape again with a stronger core.          [facility] PT Assessment - 08/18/17 1522      Observation/Other Assessments   Observations  required cues for paced breathing to lengthen pelvic floor                Pelvic Floor Special Questions - 08/18/17 1520    External Perineal Exam  L > R tensions and flinching tenderness at ischioanal fossa at the ischial tuberosity      External Palpation  R ischioanal fossa and medial ischial tuberosity     Pelvic Floor Internal Exam  pt consented verbally without contraindications     Exam Type  Vaginal    Palpation  increased severely R ischioanal fossa  9 o clock         [facility] Adult PT Treatment/Exercise - 08/18/17 1521      Exercises   Exercises  -- see pt instructions      Modalities   Modalities  Moist Heat      Moist Heat Therapy   Number Minutes Moist Heat  5 Minutes    Moist Heat Location  Other (comment)      Manual Therapy   Internal Pelvic Floor   3rd layer  7-9 o'clock , ischial spine, ischioanal fossa, inferior pubic rami, medial tuberosity , scar releases with MWM  external at ischioanal fossa and inferior pubic rami                   PT Long Term Goals - 08/18/17 1523      PT LONG TERM GOAL #1   Title  Pt will decrease  her COREFO score from 28% to <23 % in order to improve bowel movements and to not strain    Time  12    Period  Weeks    Status  On-going      PT LONG TERM GOAL #2   Title  Pt will decrease her [place] score from 31% to <26 %  in order to improve QOL     Time  8    Period  Weeks    Status  On-going      PT LONG TERM GOAL #3   Title  Pt will report decreased numbness in B arms by 50% and demo decreased upper trap mm tightness in order to progress to deep core coordination for improved function for ADLs    Time  6    Period  Weeks    Status  On-going      PT LONG TERM GOAL #4   Title  Pt will demo softer abdomen with palpation, improved mobility of deep core in order to progress to stability exercises for improved GI and pelvic floor function     Time  4    Period  Weeks    Status  Partially Met      PT LONG TERM GOAL #5   Title  Pt will decrease CLBP by 50% in order to progress towards fitness exercises     Time  6    Period  Weeks    Status  Achieved      Additional Long Term Goals   Additional Long Term Goals  Yes      PT LONG TERM GOAL #6   Title   Pt will demo proper technique and alignment and voice understanding on principles of exercises in order to intergrate to gym setting with less risk for injuries    Time  12    Period  Weeks    Status  Partially Met      PT LONG TERM GOAL #7   Title  Pt will decrease use of enema from 2 x month to < 1 x month in order to improve QOL    Time  8    Period  Weeks    Status  Partially Met            Plan - 08/18/17 1519    Clinical Impression Statement Pt has achieved one goal with no more LBP. Pt is progressing well towards her remaining goals. Today,  pt showed significantly decreased R posterior pelvic floor mm tightness and perineal scar restrictions post Tx. Pt continues to benefit from skilled PT.    Rehab Potential  Good    PT Frequency  1x / week    PT Duration  12 weeks    PT Treatment/Interventions  Scar mobilization;Neuromuscular re-education;Balance training;Therapeutic exercise;Therapeutic activities;Manual lymph drainage;Patient/family education;Moist Heat;Traction;Manual techniques;Gait training;Taping;Energy conservation;Functional mobility training;Stair training    Consulted and Agree with Plan of Care  Patient       Patient will benefit from skilled therapeutic intervention in order to improve the following deficits and impairments:  Postural dysfunction, Increased muscle spasms, Hypermobility, Decreased scar mobility, Decreased mobility, Decreased coordination, Decreased endurance, Decreased activity tolerance, Decreased range of motion, Decreased strength, Hypomobility, Decreased safety awareness, Improper body mechanics, Pain, Increased fascial restricitons  Visit Diagnosis: Muscle weakness (generalized)  Other abnormalities of gait and mobility  Other symptoms and signs involving the musculoskeletal system  Unspecified lack of coordination     Problem List There are no active  problems to display for this patient.   Nancy Paul ,PT, DPT,  E-RYT  08/18/2017, 3:25 PM  [place] MAIN [facility] SERVICES [address] [place], [place], [address] Phone: [phone]   Fax:  [phone]  Name: Nancy Paul MRN: [id number] Date of Birth: 1970-05-12

## 2017-08-23 ENCOUNTER — Encounter: Payer: BLUE CROSS/BLUE SHIELD | Admitting: Physical Therapy

## 2017-08-24 ENCOUNTER — Encounter: Payer: BLUE CROSS/BLUE SHIELD | Admitting: Physical Therapy

## 2017-08-24 ENCOUNTER — Ambulatory Visit: Payer: BLUE CROSS/BLUE SHIELD | Admitting: Physical Therapy

## 2017-08-24 DIAGNOSIS — M6281 Muscle weakness (generalized): Secondary | ICD-10-CM | POA: Diagnosis not present

## 2017-08-24 DIAGNOSIS — R2689 Other abnormalities of gait and mobility: Secondary | ICD-10-CM

## 2017-08-24 DIAGNOSIS — R29898 Other symptoms and signs involving the musculoskeletal system: Secondary | ICD-10-CM

## 2017-08-24 DIAGNOSIS — R279 Unspecified lack of coordination: Secondary | ICD-10-CM

## 2017-08-25 NOTE — Therapy (Signed)
[place] MAIN [facility] SERVICES [address] [place], [place], [address] Phone: [phone]   Fax:  [phone]  Physical Therapy Treatment  Patient Details  Name: Nancy Paul MRN: [id number] Date of Birth: 1970-08-27 Referring Provider: Josephina Gip, PA   Encounter Date: 08/24/2017  PT End of Session - 08/24/17 0916    Visit Number  8    Number of Visits  12    Date for PT Re-Evaluation  11/09/17    PT Start Time  0910    PT Stop Time  1008    PT Time Calculation (min)  58 min    Activity Tolerance  Patient tolerated treatment well;No increased pain    Behavior During Therapy  WFL for tasks assessed/performed       Past Medical History:  Diagnosis Date  . Anxiety     No past surgical history on file.  There were no vitals filed for this visit.  Subjective Assessment - 08/24/17 0914    Subjective  Pt is having constipation issues now and no longer has the dribble nor urinary lekaage issue. Pt notices she is not completely emptying bowels after PT sessions.  Pt had no pain with Tx.  Prior to PT, pt is still using an enema 3x week and now only 1 x week.  Pt has Type 4 consistency but she states "it feels like there is something there." " I wipe and wipe and I feelso gross".  It takes time to clean at any time when she has the bowels.       Pertinent History  Pt stopped taking her anxiety medication 2 months ago. Pt has not let her MD know but will be telling her on 06/01/17 at her physical appt. Stressors: gaining 32 lbs after hysterectomy,  Pt has worked with her PCP Dr. Chauncy Passy for weight loss.  Pt used to work out 3-4x week, cardio, weights. Pt has stopped doing sti ups and crunches since hysterectomy.       Patient Stated Goals  Pt would like to feel good, regain energy level, and live happy, and be in shape again with a stronger core.                     Pelvic Floor Special Questions - 08/24/17 1000    Pelvic Floor  Internal Exam  pt consented verbally without contraindications     Exam Type  Rectal    Palpation  increased severely restrictions at 12 o clock at EAS to puborectalis anterior ( decreased post Tx)         [facility] Adult PT Treatment/Exercise - 08/25/17 1117      Modalities   Modalities  Moist Heat      Moist Heat Therapy   Number Minutes Moist Heat  5 Minutes    Moist Heat Location  Other (comment) perineal       Manual Therapy   Internal Pelvic Floor  intrarectal : 12-2 o'clock, externally, 5 o' clock to perineum with fascial releases with MWM ( R sidelying)                   PT Long Term Goals - 08/18/17 1523      PT LONG TERM GOAL #1   Title  Pt will decrease her COREFO score from 28% to <23 % in order to improve bowel movements and to not strain    Time  12    Period  Weeks    Status  On-going      PT LONG TERM GOAL #2   Title  Pt will decrease her [place] score from 31% to <26 %  in order to improve QOL     Time  8    Period  Weeks    Status  On-going      PT LONG TERM GOAL #3   Title  Pt will report decreased numbness in B arms by 50% and demo decreased upper trap mm tightness in order to progress to deep core coordination for improved function for ADLs    Time  6    Period  Weeks    Status  On-going      PT LONG TERM GOAL #4   Title  Pt will demo softer abdomen with palpation, improved mobility of deep core in order to progress to stability exercises for improved GI and pelvic floor function     Time  4    Period  Weeks    Status  Partially Met      PT LONG TERM GOAL #5   Title  Pt will decrease CLBP by 50% in order to progress towards fitness exercises     Time  6    Period  Weeks    Status  Achieved      Additional Long Term Goals   Additional Long Term Goals  Yes      PT LONG TERM GOAL #6   Title  Pt will demo proper technique and alignment and voice understanding on principles of exercises in order to intergrate to gym setting with less risk for  injuries    Time  12    Period  Weeks    Status  Partially Met      PT LONG TERM GOAL #7   Title  Pt will decrease use of enema from 2 x month to < 1 x month in order to improve QOL    Time  8    Period  Weeks    Status  Partially Met            Plan - 08/25/17 1120    Clinical Impression Statement  Pt demo'd decreased tenderness/tensions in scar restrictions in anterior rectal wall post Tx.  Pt tolerated internal pelvic Tx to release R sided perineal scar restrictions when PT utilized MWM techniques with lighter palpation and withheld sustained pressure which was less tolerable. Pt continues to benefit from skilled PT.    Rehab Potential  Good    PT Frequency  1x / week    PT Duration  12 weeks    PT Treatment/Interventions  Scar mobilization;Neuromuscular re-education;Balance training;Therapeutic exercise;Therapeutic activities;Manual lymph drainage;Patient/family education;Moist Heat;Traction;Manual techniques;Gait training;Taping;Energy conservation;Functional mobility training;Stair training    Consulted and Agree with Plan of Care  Patient       Patient will benefit from skilled therapeutic intervention in order to improve the following deficits and impairments:  Postural dysfunction, Increased muscle spasms, Hypermobility, Decreased scar mobility, Decreased mobility, Decreased coordination, Decreased endurance, Decreased activity tolerance, Decreased range of motion, Decreased strength, Hypomobility, Decreased safety awareness, Improper body mechanics, Pain, Increased fascial restricitons  Visit Diagnosis: Muscle weakness (generalized)  Other abnormalities of gait and mobility  Other symptoms and signs involving the musculoskeletal system  Unspecified lack of coordination     Problem List There are no active problems to display for this patient.   [name] ,PT, DPT, E-RYT  08/25/2017, 11:24 AM  [place]  [place] MAIN [facility]  SERVICES [place], [place], [address] Phone: [phone]   Fax:  [phone]  Name: Nancy Paul MRN: [id number] Date of Birth: 07/07/1970

## 2017-08-29 ENCOUNTER — Ambulatory Visit: Payer: BLUE CROSS/BLUE SHIELD | Admitting: Physical Therapy

## 2017-09-04 ENCOUNTER — Ambulatory Visit: Payer: BLUE CROSS/BLUE SHIELD | Admitting: Physical Therapy

## 2017-09-06 ENCOUNTER — Ambulatory Visit: Payer: BLUE CROSS/BLUE SHIELD | Attending: Gastroenterology | Admitting: Physical Therapy

## 2017-09-06 DIAGNOSIS — R29898 Other symptoms and signs involving the musculoskeletal system: Secondary | ICD-10-CM | POA: Insufficient documentation

## 2017-09-06 DIAGNOSIS — R2689 Other abnormalities of gait and mobility: Secondary | ICD-10-CM | POA: Diagnosis present

## 2017-09-06 DIAGNOSIS — M6281 Muscle weakness (generalized): Secondary | ICD-10-CM | POA: Diagnosis present

## 2017-09-06 DIAGNOSIS — R279 Unspecified lack of coordination: Secondary | ICD-10-CM | POA: Diagnosis present

## 2017-09-06 NOTE — Therapy (Signed)
[facility] MA[facility] REHAB SERVICES [address] , [place], [address] Phone: [phone]   Fax:  [phone]  Physical Therapy Treatment  Patient Details  Name: Nancy Paul MRN: [id number] Date of Birth: 05/15/1970 Referring Provider: Danielle Maier, PA   Encounter Date: 09/06/2017  PT End of Session - 09/06/17 1236    Visit Number  9    Number of Visits  12    Date for PT Re-Evaluation  11/09/17    PT Start Time  1115    PT Stop Time  1210    PT Time Calculation (min)  55 min    Activity Tolerance  Patient tolerated treatment well;No increased pain    Behavior During Therapy  WFL for tasks assessed/performed       Past Medical History:  Diagnosis Date  . Anxiety     No past surgical history on file.  There were no vitals filed for this visit.  Subjective Assessment - 09/06/17 1116    Subjective  Pt reported she had more complete bowel movements without feeling like something was still there.     Pertinent History  Pt stopped taking her anxiety medication 2 months ago. Pt has not let her MD know but will be telling her on 06/01/17 at her physical appt. Stressors: gaining 32 lbs after hysterectomy,  Pt has worked with her PCP Dr. Lam for weight loss.  Pt used to work out 3-4x week, cardio, weights. Pt has stopped doing sti ups and crunches since hysterectomy.       Patient Stated Goals  Pt would like to feel good, regain energy level, and live happy, and be in shape again with a stronger core.                     Pelvic Floor Special Questions - 09/06/17 1156    Pelvic Floor Internal Exam  pt consented verbally without contraindications     Exam Type  Rectal    Palpation  increased severely restrictions at 5-7 o clock at EAS to puborectalis 5-o'clock and externally: at ischial ramus ( decreased post Tx)         [facility] Adult PT Treatment/Exercise - 09/06/17 1230      Therapeutic Activites    Therapeutic Activities   -- discussion about pelvic pain and integration with functional      Modalities   Modalities  Moist Heat      Moist Heat Therapy   Moist Heat Location  Other (comment) perineal       Manual Therapy   Internal Pelvic Floor  intrarectal : 5-7 o' clock to perineum, EAS, posterior ischial ramus with fascial releases with MWM ( L sidelying)                   PT Long Term Goals - 08/18/17 1523      PT LONG TERM GOAL #1   Title  Pt will decrease her COREFO score from 28% to <23 % in order to improve bowel movements and to not strain    Time  12    Period  Weeks    Status  On-going      PT LONG TERM GOAL #2   Title  Pt will decrease her [place] score from 31% to <26 %  in order to improve QOL     Time  8    Period  Weeks    Status  On-going        PT LONG TERM GOAL #3   Title  Pt will report decreased numbness in B arms by 50% and demo decreased upper trap mm tightness in order to progress to deep core coordination for improved function for ADLs    Time  6    Period  Weeks    Status  On-going      PT LONG TERM GOAL #4   Title  Pt will demo softer abdomen with palpation, improved mobility of deep core in order to progress to stability exercises for improved GI and pelvic floor function     Time  4    Period  Weeks    Status  Partially Met      PT LONG TERM GOAL #5   Title  Pt will decrease CLBP by 50% in order to progress towards fitness exercises     Time  6    Period  Weeks    Status  Achieved      Additional Long Term Goals   Additional Long Term Goals  Yes      PT LONG TERM GOAL #6   Title  Pt will demo proper technique and alignment and voice understanding on principles of exercises in order to intergrate to gym setting with less risk for injuries    Time  12    Period  Weeks    Status  Partially Met      PT LONG TERM GOAL #7   Title  Pt will decrease use of enema from 2 x month to < 1 x month in order to improve QOL    Time  8    Period  Weeks    Status   Partially Met            Plan - 09/06/17 1236    Clinical Impression Statement  Pt responded well to last session which helped her to eliminate bowels completely. Today, pt's remaining pelvic floor scar restrictions are addressed with manual Tx. Pt tolerated to mobilization with movement with internal and external manual Tx better than with sustained pressure at problem areas. Plan to address tenderness and tightness at ischial rami and larger mm attachments at upcoming session. Pt had improved hip extension with less pain and therefore, recommended pt to return to walking on elliptical machine this week for 30 mins which is an activity she use to be able to due prior to her surgery and pain. Pt was provided education on pelvic pain retraining and antaomy/ physiology, Pt continues to benefit from skilled PT.     Rehab Potential  Good    PT Frequency  1x / week    PT Duration  12 weeks    PT Treatment/Interventions  Scar mobilization;Neuromuscular re-education;Balance training;Therapeutic exercise;Therapeutic activities;Manual lymph drainage;Patient/family education;Moist Heat;Traction;Manual techniques;Gait training;Taping;Energy conservation;Functional mobility training;Stair training    Consulted and Agree with Plan of Care  Patient       Patient will benefit from skilled therapeutic intervention in order to improve the following deficits and impairments:  Postural dysfunction, Increased muscle spasms, Hypermobility, Decreased scar mobility, Decreased mobility, Decreased coordination, Decreased endurance, Decreased activity tolerance, Decreased range of motion, Decreased strength, Hypomobility, Decreased safety awareness, Improper body mechanics, Pain, Increased fascial restricitons  Visit Diagnosis: Muscle weakness (generalized)  Other abnormalities of gait and mobility  Other symptoms and signs involving the musculoskeletal system  Unspecified lack of coordination     Problem  List There are no active problems to display for this patient.   Yeung,Shin Yiing ,  PT, DPT, E-RYT  09/06/2017, 12:41 PM  [place] [facility] MAIN REHAB SERVICES [address] [place], [place], [address] Phone: [phone]   Fax:  [phone]  Name: Nancy Paul MRN: [id number] Date of Birth: 07/16/1970   

## 2017-09-06 NOTE — Patient Instructions (Addendum)
Elliptical 30 min with warm and cool down  Stepping lunges with 50% weight on both legs, hip width apart to stretch  scar   Review packet on pelvic function and physiology

## 2017-09-12 ENCOUNTER — Ambulatory Visit: Payer: BLUE CROSS/BLUE SHIELD | Admitting: Physical Therapy

## 2017-09-13 ENCOUNTER — Encounter: Payer: BLUE CROSS/BLUE SHIELD | Admitting: Physical Therapy

## 2017-09-14 ENCOUNTER — Ambulatory Visit: Payer: BLUE CROSS/BLUE SHIELD | Admitting: Physical Therapy

## 2017-09-21 ENCOUNTER — Ambulatory Visit: Payer: BLUE CROSS/BLUE SHIELD | Admitting: Physical Therapy

## 2017-09-26 ENCOUNTER — Ambulatory Visit: Payer: BLUE CROSS/BLUE SHIELD | Admitting: Physical Therapy

## 2017-09-26 DIAGNOSIS — R29898 Other symptoms and signs involving the musculoskeletal system: Secondary | ICD-10-CM

## 2017-09-26 DIAGNOSIS — R279 Unspecified lack of coordination: Secondary | ICD-10-CM

## 2017-09-26 DIAGNOSIS — R2689 Other abnormalities of gait and mobility: Secondary | ICD-10-CM

## 2017-09-26 DIAGNOSIS — M6281 Muscle weakness (generalized): Secondary | ICD-10-CM | POA: Diagnosis not present

## 2017-09-26 NOTE — Patient Instructions (Signed)
YOGA:  eagle pose to release back of hips  Sit in a chair position, feet together  Cross R thigh over L, press them strongly against each other  Cross R arm over L arm, palms clasp  Stay and breath 3-5 breaths  Sitting a little deeper   _____________   Quads ( standing or sidelying)  Hip flexor  Calves    _____________   Return to open book ex daily to release midback which will help the diaphragm and in turn relax pelvic floor

## 2017-09-27 NOTE — Therapy (Signed)
[place] MAIN [facility] SERVICES [address] [place], [place], [address] Phone: [phone]   Fax:  [phone]  Physical Therapy Treatment / Progress Note   Patient Details  Name: Nancy Paul MRN: [id number] Date of Birth: 06-24-1970 Referring Provider: Josephina Gip, PA  Physical Therapy Progress Note   Dates of reporting period  05/22/17 to 09/26/17  ( Last progress note reported 08/16/17)    Encounter Date: 09/26/2017  PT End of Session - 09/27/17 0936    Visit Number  10    Number of Visits  12    Date for PT Re-Evaluation  11/09/17    PT Start Time  9379    PT Stop Time  0240    PT Time Calculation (min)  53 min    Activity Tolerance  Patient tolerated treatment well;No increased pain    Behavior During Therapy  WFL for tasks assessed/performed       Past Medical History:  Diagnosis Date  . Anxiety     No past surgical history on file.  There were no vitals filed for this visit.  Subjective Assessment - 09/26/17 1615    Subjective  Pt missed the appts the past 2 weeks and did not have any bowel issue with emptying until this past week. She had to resort to using an enema but it made her stomach upset. Pt also reported she started walking for 1 hours last week for 3-4 days but did not stretch afterall.      Pertinent History  Pt stopped taking her anxiety medication 2 months ago. Pt has not let her MD know but will be telling her on 06/01/17 at her physical appt. Stressors: gaining 32 lbs after hysterectomy,  Pt has worked with her PCP Dr. Chauncy Passy for weight loss.  Pt used to work out 3-4x week, cardio, weights. Pt has stopped doing sti ups and crunches since hysterectomy.       Patient Stated Goals  Pt would like to feel good, regain energy level, and live happy, and be in shape again with a stronger core.          [facility] PT Assessment - 09/27/17 0944      Observation/Other Assessments   Observations  hyperextension of knees        Coordination   Gross Motor Movements are Fluid and Coordinated  -- limited pelvic floor lengthening      Palpation   Spinal mobility  signficant tensions at thoracic spine with limited diaphragmatic excursion'                Pelvic Floor Special Questions - 09/27/17 0940    External Palpation  coccygeus R, lateral border of sacrum R with glut attachments     Pelvic Floor Internal Exam  pt consented verbally without contraindications     Exam Type  Rectal    Palpation  increased moderately restrictions at anterior portion of rectum and EAS         [facility] Adult PT Treatment/Exercise - 09/27/17 0943      Neuro Re-ed    Neuro Re-ed Details   see pt instructions       Manual Therapy   Manual therapy comments  external: at problem areas noted in assessment     Internal Pelvic Floor  intrarectal :at problem areas noted in assessment                   PT Long Term Goals -  08/18/17 1523      PT LONG TERM GOAL #1   Title  Pt will decrease her COREFO score from 28% to <23 % in order to improve bowel movements and to not strain    Time  12    Period  Weeks    Status  On-going      PT LONG TERM GOAL #2   Title  Pt will decrease her [place] score from 31% to <26 %  in order to improve QOL     Time  8    Period  Weeks    Status  On-going      PT LONG TERM GOAL #3   Title  Pt will report decreased numbness in B arms by 50% and demo decreased upper trap mm tightness in order to progress to deep core coordination for improved function for ADLs    Time  6    Period  Weeks    Status  On-going      PT LONG TERM GOAL #4   Title  Pt will demo softer abdomen with palpation, improved mobility of deep core in order to progress to stability exercises for improved GI and pelvic floor function     Time  4    Period  Weeks    Status  Partially Met      PT LONG TERM GOAL #5   Title  Pt will decrease CLBP by 50% in order to progress towards fitness exercises     Time  6     Period  Weeks    Status  Achieved      Additional Long Term Goals   Additional Long Term Goals  Yes      PT LONG TERM GOAL #6   Title  Pt will demo proper technique and alignment and voice understanding on principles of exercises in order to intergrate to gym setting with less risk for injuries    Time  12    Period  Weeks    Status  Partially Met      PT LONG TERM GOAL #7   Title  Pt will decrease use of enema from 2 x month to < 1 x month in order to improve QOL    Time  8    Period  Weeks    Status Achieved           Plan - 09/26/17 1720    Clinical Impression Statement Pt had decreased use of enemas to  2-3 x across the past 4 months from 2-3 x/ week.   Pt had one full week of complete emptying of bowels since last session which was 2 weeks ago.  However, the week after, pt had a relapse with incomplete emptying of bowels with resort to using an enema. Suspect this relapse was due to pt's increased her walking routine to 1 hour for 3-4 days last week without adequate stretching, leading to increased mm tensions at thoracic and pelvic/ hip area and in turn,  leading to difficulty with elimination.  Pt demo'd decreased pelvic floor restrictions in the anal canal but tenderness and mild tightness still remain in addition to the tightness at the attachments of global mm attached at pubic tubercle and lateral sacrum. Pt showed decreased mm tightness at thoracic region post Tx and demo'd increased dipahragmatic/ pelvic floor excursion.  Reinforced the importance of flexibility program to optimize deep core function, pelvic floor function, and hip mobility. Plan to address lower kinetic chain for  injury prevention as pt is becoming more active and has deficits such as hyperextension of knees and poor alignment.  Pt continues to benefit from skilled PT.     Rehab Potential  Good    PT Frequency  1x / week    PT Duration  12 weeks    PT Treatment/Interventions  Scar  mobilization;Neuromuscular re-education;Balance training;Therapeutic exercise;Therapeutic activities;Manual lymph drainage;Patient/family education;Moist Heat;Traction;Manual techniques;Gait training;Taping;Energy conservation;Functional mobility training;Stair training    Consulted and Agree with Plan of Care  Patient       Patient will benefit from skilled therapeutic intervention in order to improve the following deficits and impairments:  Postural dysfunction, Increased muscle spasms, Hypermobility, Decreased scar mobility, Decreased mobility, Decreased coordination, Decreased endurance, Decreased activity tolerance, Decreased range of motion, Decreased strength, Hypomobility, Decreased safety awareness, Improper body mechanics, Pain, Increased fascial restricitons  Visit Diagnosis: Muscle weakness (generalized)  Other abnormalities of gait and mobility  Other symptoms and signs involving the musculoskeletal system  Unspecified lack of coordination     Problem List There are no active problems to display for this patient.   [name] ,PT, DPT, E-RYT  09/27/2017, 9:48 AM  [place] MAIN [facility] SERVICES [address] [place], [place], [address] Phone: [phone]   Fax:  [phone]  Name: Nancy Paul MRN: [id number] Date of Birth: Mar 01, 1971

## 2017-10-05 ENCOUNTER — Ambulatory Visit: Payer: BLUE CROSS/BLUE SHIELD | Attending: Gastroenterology | Admitting: Physical Therapy

## 2017-10-05 DIAGNOSIS — R2689 Other abnormalities of gait and mobility: Secondary | ICD-10-CM | POA: Diagnosis present

## 2017-10-05 DIAGNOSIS — R29898 Other symptoms and signs involving the musculoskeletal system: Secondary | ICD-10-CM | POA: Insufficient documentation

## 2017-10-05 DIAGNOSIS — R279 Unspecified lack of coordination: Secondary | ICD-10-CM | POA: Insufficient documentation

## 2017-10-05 DIAGNOSIS — M6281 Muscle weakness (generalized): Secondary | ICD-10-CM | POA: Insufficient documentation

## 2017-10-05 NOTE — Patient Instructions (Addendum)
Birddog   Table top position,  6 points of contact: paw hands, knees hip width apart, toes tucked under  Shoulders down and back like squeezing armpits  Not sagging back   L arm up , thumbs up, arm is out like a half"V" shoulder blade slides down and back  R knee straight, toes tucked on the ground,  Lengthen whole spine as if yard stick is balanced on spine, chin tucked   L + R = 1 rep 10 reps   X 2   ____   Transition from standing to floor: Wide squat like you are about to pick something up from the floor --> hands on the ground (all fours)  To get up, all fours--> lifts hips in to Downward Facing Dog  and walk hands backwards to feet --> mini quat --> hands on thighs, then hips then pause to avoid (moving too quickly up/ blood rush) -->  knees glide forward and roll  Hips up instead of hinging spine up

## 2017-10-05 NOTE — Therapy (Signed)
[place] MAIN [facility] SERVICES [address] [place], [place], [address] Phone: [phone]   Fax:  [phone]  Physical Therapy Treatment  Patient Details  Name: Nancy Paul MRN: [id number] Date of Birth: 1971-01-07 Referring Provider: Josephina Gip, PA   Encounter Date: 10/05/2017  PT End of Session - 10/05/17 0907    Visit Number  11    Number of Visits  12    Date for PT Re-Evaluation  11/09/17    PT Start Time  0817    PT Stop Time  0907    PT Time Calculation (min)  50 min    Activity Tolerance  Patient tolerated treatment well;No increased pain    Behavior During Therapy  WFL for tasks assessed/performed       Past Medical History:  Diagnosis Date  . Anxiety     No past surgical history on file.  There were no vitals filed for this visit.  Subjective Assessment - 10/05/17 0822    Subjective  Pt has not had to use an enema the past 2 weeks. Pt feels she is completely emptying without straining.     Pertinent History  Pt stopped taking her anxiety medication 2 months ago. Pt has not let her MD know but will be telling her on 06/01/17 at her physical appt. Stressors: gaining 32 lbs after hysterectomy,  Pt has worked with her PCP Dr. Chauncy Passy for weight loss.  Pt used to work out 3-4x week, cardio, weights. Pt has stopped doing sti ups and crunches since hysterectomy.       Patient Stated Goals  Pt would like to feel good, regain energy level, and live happy, and be in shape again with a stronger core.          [facility] PT Assessment - 10/05/17 0901      Floor to Stand   Comments  poor alignment at knees       Other:   Other/ Comments  birddog: required cues for stability and lumbar lordosis                 Pelvic Floor Special Questions - 10/05/17 0900    Pelvic Floor Internal Exam  pt consented verbally without contraindications     Exam Type  Rectal    Palpation  increased moderately restrictions at anterior  portion of rectum at 12 o'clock, and 5-6 o'clock and EAS         [facility] Adult PT Treatment/Exercise - 10/05/17 0914      Neuro Re-ed    Neuro Re-ed Details   see pt instructions       Manual Therapy   Manual therapy comments  external: at problem areas noted in assessment     Internal Pelvic Floor  intrarectal :at problem areas noted in assessment with MWM of R leg hip flexion/ extension insdeyling               PT Education - 10/05/17 0906    Education provided  Yes    Education Details  HEP    Person(s) Educated  Patient    Methods  Explanation;Demonstration;Tactile cues;Handout;Verbal cues          PT Long Term Goals - 08/18/17 1523      PT LONG TERM GOAL #1   Title  Pt will decrease her COREFO score from 28% to <23 % in order to improve bowel movements and to not strain    Time  12  Period  Weeks    Status  On-going      PT LONG TERM GOAL #2   Title  Pt will decrease her [place] score from 31% to <26 %  in order to improve QOL     Time  8    Period  Weeks    Status  On-going      PT LONG TERM GOAL #3   Title  Pt will report decreased numbness in B arms by 50% and demo decreased upper trap mm tightness in order to progress to deep core coordination for improved function for ADLs    Time  6    Period  Weeks    Status  On-going      PT LONG TERM GOAL #4   Title  Pt will demo softer abdomen with palpation, improved mobility of deep core in order to progress to stability exercises for improved GI and pelvic floor function     Time  4    Period  Weeks    Status  Partially Met      PT LONG TERM GOAL #5   Title  Pt will decrease CLBP by 50% in order to progress towards fitness exercises     Time  6    Period  Weeks    Status  Achieved      Additional Long Term Goals   Additional Long Term Goals  Yes      PT LONG TERM GOAL #6   Title  Pt will demo proper technique and alignment and voice understanding on principles of exercises in order to intergrate to gym  setting with less risk for injuries    Time  12    Period  Weeks    Status  Partially Met      PT LONG TERM GOAL #7   Title  Pt will decrease use of enema from 2 x month to < 1 x month in order to improve QOL    Time  8    Period  Weeks    Status  Partially Met            Plan - 10/05/17 0911    Clinical Impression Statement  Pt did not require an enema last 2 weeks. Pt demo'd signficantly decreased scar and fascial restrictions at the anterior and R lateral rectal walls, EAS, and puborectalis mm following Tx today. Added birddog exercise with cues for less lumbar lordosis and scapular control to promote intergration back to fitness exercise.  Selected this core strengthening exericse instead of bridging because this exercise helps to mobilize posterior pelvic floor whereas bridging has the potential to tighten posterior pelvic floor mm. Pt continues to benefit frmo skilled PT     Rehab Potential  Good    PT Frequency  1x / week    PT Duration  12 weeks    PT Treatment/Interventions  Scar mobilization;Neuromuscular re-education;Balance training;Therapeutic exercise;Therapeutic activities;Manual lymph drainage;Patient/family education;Moist Heat;Traction;Manual techniques;Gait training;Taping;Energy conservation;Functional mobility training;Stair training    Consulted and Agree with Plan of Care  Patient       Patient will benefit from skilled therapeutic intervention in order to improve the following deficits and impairments:  Postural dysfunction, Increased muscle spasms, Hypermobility, Decreased scar mobility, Decreased mobility, Decreased coordination, Decreased endurance, Decreased activity tolerance, Decreased range of motion, Decreased strength, Hypomobility, Decreased safety awareness, Improper body mechanics, Pain, Increased fascial restricitons  Visit Diagnosis: No diagnosis found.     Problem List There are no active problems  to display for this patient.   [name] ,PT, DPT, E-RYT  10/05/2017, 9:15 AM  [place] MAIN [facility] SERVICES [address] [place], [place], [address] Phone: [phone]   Fax:  [phone]  Name: Nancy Paul MRN: [id number] Date of Birth: 24-Feb-1971

## 2017-10-19 ENCOUNTER — Ambulatory Visit: Payer: BLUE CROSS/BLUE SHIELD | Admitting: Physical Therapy

## 2017-10-19 DIAGNOSIS — R29898 Other symptoms and signs involving the musculoskeletal system: Secondary | ICD-10-CM

## 2017-10-19 DIAGNOSIS — M6281 Muscle weakness (generalized): Secondary | ICD-10-CM | POA: Diagnosis not present

## 2017-10-19 DIAGNOSIS — R2689 Other abnormalities of gait and mobility: Secondary | ICD-10-CM

## 2017-10-19 DIAGNOSIS — R279 Unspecified lack of coordination: Secondary | ICD-10-CM

## 2017-10-19 NOTE — Patient Instructions (Signed)
  Priobiotics recommendation of Jamse MeadJessica Drummond, [email or web address]  [place] FemDophilus [email or web address]  Please consult your doctor about taking supplements as it is outside Physical Therapy scope of practice

## 2017-10-19 NOTE — Therapy (Signed)
[place] MAIN [facility] SERVICES [address] [place], [place], [address] Phone: [phone]   Fax:  [phone]  Physical Therapy Treatment  Patient Details  Name: Nancy Paul MRN: [id number] Date of Birth: [birth year] Referring Provider: Josephina Gip, PA   Encounter Date: 10/19/2017  PT End of Session - 10/19/17 1030    Visit Number  12    Number of Visits  12    Date for PT Re-Evaluation  --    Authorization Type  re-cert and reassess goals at next session    PT Start Time  0808    PT Stop Time  0905    PT Time Calculation (min)  57 min    Activity Tolerance  Patient tolerated treatment well;No increased pain    Behavior During Therapy  WFL for tasks assessed/performed       Past Medical History:  Diagnosis Date  . Anxiety     No past surgical history on file.  There were no vitals filed for this visit.  Subjective Assessment - 10/19/17 0814    Subjective  Pt reported she started Wellbutrin for anxiety which has a side-effect with constipation. Her PCP has informed her to take Fiber One packet to counteract this sideeffect. Pt had no more difficulty with bowel movements and did not have to go back a second time to completely empty for 1 week.      Pertinent History  Pt stopped taking her anxiety medication 2 months ago. Pt has not let her MD know but will be telling her on 06/01/17 at her physical appt. Stressors: gaining 32 lbs after hysterectomy,  Pt has worked with her PCP Dr. Chauncy Passy for weight loss.  Pt used to work out 3-4x week, cardio, weights. Pt has stopped doing sti ups and crunches since hysterectomy.       Patient Stated Goals  Pt would like to feel good, regain energy level, and live happy, and be in shape again with a stronger core.                     Pelvic Floor Special Questions - 10/19/17 1038    Pelvic Floor Internal Exam  pt consented verbally without contraindications     Exam Type  Rectal    Palpation  increased restrictions at 6 o'clock only                PT Education - 10/19/17 1028    Education provided  Yes    Education Details  HEP, education on physiology and anatomy of pelvic floor in sexual function as pt had pain with sexual function in the past     Person(s) Educated  Patient    Methods  Explanation;Demonstration;Tactile cues;Verbal cues;Handout    Comprehension  Returned demonstration;Verbalized understanding          PT Long Term Goals - 10/19/17 1030      PT LONG TERM GOAL #1   Title  Pt will decrease her COREFO score from 28% to <23 % in order to improve bowel movements and to not strain    Time  12    Period  Weeks    Status  On-going      PT LONG TERM GOAL #2   Title  Pt will decrease her [place] score from 31% to <26 %  in order to improve QOL     Time  8    Period  Weeks    Status  On-going      PT LONG TERM GOAL #3   Title  Pt will report decreased numbness in B arms by 50% and demo decreased upper trap mm tightness in order to progress to deep core coordination for improved function for ADLs    Time  6    Period  Weeks    Status  On-going      PT LONG TERM GOAL #4   Title  Pt will demo softer abdomen with palpation, improved mobility of deep core in order to progress to stability exercises for improved GI and pelvic floor function     Time  4    Period  Weeks    Status  Partially Met      PT LONG TERM GOAL #5   Title  Pt will decrease CLBP by 50% in order to progress towards fitness exercises     Time  6    Period  Weeks    Status  Achieved      PT LONG TERM GOAL #6   Title  Pt will demo proper technique and alignment and voice understanding on principles of exercises in order to intergrate to gym setting with less risk for injuries    Time  12    Period  Weeks    Status  Partially Met      PT LONG TERM GOAL #7   Title  Pt will decrease use of enema from 2 x month to < 1 x month in order to improve QOL    Time  8     Period  Weeks    Status  Partially Met            Plan - 10/19/17 1031    Clinical Impression Statement  Pt's bowel function is improving as she was able to have complete emptying for 1 week. Pt showed good carry over with less restrictions and tightness at her posterior pelvic floor. Pt required scar releases at only at the  6'o'clock position today with intrrectal Tx.  Provided education on physiology and anatomy on pelvic floor function to address remaining goals. Pt continues to benefit from skilled PT.     Rehab Potential  Good    PT Frequency  1x / week    PT Duration  12 weeks    PT Treatment/Interventions  Scar mobilization;Neuromuscular re-education;Balance training;Therapeutic exercise;Therapeutic activities;Manual lymph drainage;Patient/family education;Moist Heat;Traction;Manual techniques;Gait training;Taping;Energy conservation;Functional mobility training;Stair training    Consulted and Agree with Plan of Care  Patient       Patient will benefit from skilled therapeutic intervention in order to improve the following deficits and impairments:  Postural dysfunction, Increased muscle spasms, Hypermobility, Decreased scar mobility, Decreased mobility, Decreased coordination, Decreased endurance, Decreased activity tolerance, Decreased range of motion, Decreased strength, Hypomobility, Decreased safety awareness, Improper body mechanics, Pain, Increased fascial restricitons  Visit Diagnosis: Muscle weakness (generalized)  Other abnormalities of gait and mobility  Other symptoms and signs involving the musculoskeletal system  Unspecified lack of coordination     Problem List There are no active problems to display for this patient.   [name] ,PT, DPT, E-RYT  10/19/2017, 10:38 AM  [place] MAIN [facility] SERVICES [address] [place], [place], [address] Phone: [phone]   Fax:  [phone]  Name: Nancy Paul MRN:  [id number] Date of Birth: 09-25-1970

## 2017-11-02 ENCOUNTER — Ambulatory Visit: Payer: BLUE CROSS/BLUE SHIELD | Admitting: Physical Therapy

## 2017-11-21 ENCOUNTER — Ambulatory Visit: Payer: BLUE CROSS/BLUE SHIELD | Attending: Gastroenterology | Admitting: Physical Therapy

## 2017-11-21 DIAGNOSIS — M6281 Muscle weakness (generalized): Secondary | ICD-10-CM | POA: Diagnosis not present

## 2017-11-21 DIAGNOSIS — R29898 Other symptoms and signs involving the musculoskeletal system: Secondary | ICD-10-CM | POA: Insufficient documentation

## 2017-11-21 DIAGNOSIS — R279 Unspecified lack of coordination: Secondary | ICD-10-CM | POA: Diagnosis present

## 2017-11-21 DIAGNOSIS — R2689 Other abnormalities of gait and mobility: Secondary | ICD-10-CM | POA: Insufficient documentation

## 2017-11-21 NOTE — Therapy (Signed)
[place] MAIN [facility] SERVICES [address] [place], [place], [address] Phone: [phone]   Fax:  [phone]  Physical Therapy Treatment / Progress Note   Patient Details  Name: Nancy Paul MRN: [id number] Date of Birth: Nov 15, 1970 Referring Provider: Josephina Gip, PA   Encounter Date: 11/21/2017  PT End of Session - 11/21/17 1352    Visit Number  13    Number of Visits  24    Date for PT Re-Evaluation  02/13/18    Authorization Type  --    PT Start Time  0815    PT Stop Time  0913    PT Time Calculation (min)  58 min    Activity Tolerance  Patient tolerated treatment well;No increased pain    Behavior During Therapy  WFL for tasks assessed/performed       Past Medical History:  Diagnosis Date  . Anxiety     No past surgical history on file.  There were no vitals filed for this visit.  Subjective Assessment - 11/21/17 1133    Subjective  Pt reports she has had no difficulty with eliminating completely with bowel movements across the past 3 weeks except on one occasion. Pt is noticing her bowels moving down with the breathing practices     Pertinent History  Pt stopped taking her anxiety medication 2 months ago. Pt has not let her MD know but will be telling her on 06/01/17 at her physical appt. Stressors: gaining 32 lbs after hysterectomy,  Pt has worked with her PCP Dr. Chauncy Passy for weight loss.  Pt used to work out 3-4x week, cardio, weights. Pt has stopped doing sti ups and crunches since hysterectomy.       Patient Stated Goals  Pt would like to feel good, regain energy level, and live happy, and be in shape again with a stronger core.                     Pelvic Floor Special Questions - 11/21/17 1352    External Palpation  minor fascial tightness at pubic rami and ishial tuborosity L     Prolapse  Anterior Wall   within intoitus, slightly distal to pubic symphysis   Pelvic Floor Internal Exam  pt consented  verbally without contraindications     Exam Type  Vaginal    Palpation  overuse of oblique, bearing down, lacked circumferential/ sequential contraction all 3 layers     Strength  weak squeeze, no lift        [facility] Adult PT Treatment/Exercise - 11/21/17 1354      Neuro Re-ed    Neuro Re-ed Details   provided tactile/ visual cues for a more sequential and circumferential contraction of pelvic floor mm       Exercises   Exercises  --   see pt instructions     Manual Therapy   Manual therapy comments  external: MWM hip flex/ext in sideyling with sustained pressure at problem areas noted in assessment     Internal Pelvic Floor  intravaginal: facilitation of anterior mm for a more cranial position of bladder.              PT Education - 11/21/17 1402    Education provided  Yes    Education Details  HEP    Person(s) Educated  Patient    Methods  Explanation;Demonstration;Tactile cues;Verbal cues;Handout    Comprehension  Returned demonstration;Verbalized understanding  PT Long Term Goals - 11/21/17 1357      PT LONG TERM GOAL #1   Title  Pt will decrease her COREFO score from 28% to <23 % in order to improve bowel movements and to not strain    Time  12    Period  Weeks    Status  On-going      PT LONG TERM GOAL #2   Title  Pt will decrease her Cordova score from 31% to <26 %  in order to improve QOL     Time  8    Period  Weeks    Status  On-going      PT LONG TERM GOAL #3   Title  Pt will report decreased numbness in B arms by 50% and demo decreased upper trap mm tightness in order to progress to deep core coordination for improved function for ADLs    Time  6    Period  Weeks    Status  On-going      PT LONG TERM GOAL #4   Title  Pt will demo softer abdomen with palpation, improved mobility of deep core in order to progress to stability exercises for improved GI and pelvic floor function     Time  4    Period  Weeks    Status  Achieved      PT LONG  TERM GOAL #5   Title  Pt will decrease CLBP by 50% in order to progress towards fitness exercises     Time  6    Period  Weeks    Status  Achieved      PT LONG TERM GOAL #6   Title  Pt will demo proper technique and alignment and voice understanding on principles of exercises in order to intergrate to gym setting with less risk for injuries    Time  12    Period  Weeks    Status  Partially Met      PT LONG TERM GOAL #7   Title  Pt will decrease use of enema from 2 x month to < 1 x month in order to improve QOL    Time  8    Period  Weeks    Status  Achieved            Plan - 11/21/17 1356    Clinical Impression Statement  Pt has achieved 3/7 goals and is progressing well towards her remaining goals. Pt has reported no difficulty with complete elimination of bowels across 3 weeks with the exception of one incident. Her back pain has also improved. Pt has demo'd signficantly decreased scar restrictions at her rectal wall and decreased mm tightness at pelvic floor mm. Pt is continuing to improve proper pelvic floor and deep core coordinaiton without straining with oblique mm in order to allow for proper cranial movement of pelvic floor mm. Pt demo'd slightly lower position of bladder throgugh vaginal assessment but demo'd improved coordination with  a more cranial position after training. Pt continues to benefit from skilled PT in order to restore functional tasks with pelvic floor.   Rehab Potential  Good    PT Frequency  1x / week    PT Duration  12 weeks    PT Treatment/Interventions  Scar mobilization;Neuromuscular re-education;Balance training;Therapeutic exercise;Therapeutic activities;Manual lymph drainage;Patient/family education;Moist Heat;Traction;Manual techniques;Gait training;Taping;Energy conservation;Functional mobility training;Stair training    Consulted and Agree with Plan of Care  Patient       Patient will  benefit from skilled therapeutic intervention in order to  improve the following deficits and impairments:  Postural dysfunction, Increased muscle spasms, Hypermobility, Decreased scar mobility, Decreased mobility, Decreased coordination, Decreased endurance, Decreased activity tolerance, Decreased range of motion, Decreased strength, Hypomobility, Decreased safety awareness, Improper body mechanics, Pain, Increased fascial restricitons  Visit Diagnosis: Muscle weakness (generalized)  Other abnormalities of gait and mobility  Other symptoms and signs involving the musculoskeletal system  Unspecified lack of coordination     Problem List There are no active problems to display for this patient.   [name] 11/21/2017, 2:02 PM  [place] MAIN [facility] SERVICES [address] [place], [place], [address] Phone: [phone]   Fax:  [phone]  Name: Nancy Paul MRN: [id number] Date of Birth: Jan 19, 1971

## 2017-11-21 NOTE — Patient Instructions (Signed)
Practice proper pelvic floor coordination  Inhale: expand pelvic floor muscles Exhale" "j" scoop, allow pelvic floor to close, lift first before belly sinks   ( not "draw abdominal muscle to spine" or strain with abdominal muscles")   ____  Pillow under hips with deep core level 1 , quick squeeze with pelvic floor  ( j scoop)   Pillow under hips with deep core level 2 with awareness of j scoop and less pushing down with abdominal muscles

## 2017-11-29 ENCOUNTER — Ambulatory Visit: Payer: BLUE CROSS/BLUE SHIELD | Admitting: Physical Therapy

## 2017-12-05 ENCOUNTER — Ambulatory Visit: Payer: BLUE CROSS/BLUE SHIELD | Admitting: Physical Therapy

## 2017-12-13 ENCOUNTER — Ambulatory Visit: Payer: BLUE CROSS/BLUE SHIELD | Attending: Gastroenterology | Admitting: Physical Therapy

## 2017-12-13 DIAGNOSIS — R279 Unspecified lack of coordination: Secondary | ICD-10-CM | POA: Diagnosis present

## 2017-12-13 DIAGNOSIS — R29898 Other symptoms and signs involving the musculoskeletal system: Secondary | ICD-10-CM | POA: Diagnosis present

## 2017-12-13 DIAGNOSIS — R2689 Other abnormalities of gait and mobility: Secondary | ICD-10-CM | POA: Diagnosis present

## 2017-12-13 DIAGNOSIS — M6281 Muscle weakness (generalized): Secondary | ICD-10-CM | POA: Insufficient documentation

## 2017-12-13 NOTE — Therapy (Signed)
[place] [facility] REHAB SERVICES [address] [place], [place], [address] Phone: [phone]   Fax:  [phone]  Physical Therapy Treatment  Patient Details  Name: Nancy Paul MRN: [id number] Date of Birth: 05/10/1970 Referring Provider (PT): Danielle Maier, PA   Encounter Date: 12/13/2017  PT End of Session - 12/13/17 0825    Visit Number  14    Number of Visits  24    Date for PT Re-Evaluation  02/13/18    PT Start Time  0811    PT Stop Time  0906    PT Time Calculation (min)  55 min    Activity Tolerance  Patient tolerated treatment well;No increased pain    Behavior During Therapy  WFL for tasks assessed/performed       Past Medical History:  Diagnosis Date  . Anxiety     No past surgical history on file.  There were no vitals filed for this visit.  Subjective Assessment - 12/13/17 0825    Subjective  Pt in the past had cramping and pressure feeling associated with pelvic pain. Pt's bowel movements have improved 1) pt has not used an enema for one month, pt no longer has to use her finger to complete bowel movements for at least months, pt's stool consistency has been consistently Type 4 for one month and she has had no need to strain.     Pertinent History  Pt stopped taking her anxiety medication 2 months ago. Pt has not let her MD know but will be telling her on 06/01/17 at her physical appt. Stressors: gaining 32 lbs after hysterectomy,  Pt has worked with her PCP Dr. Lam for weight loss.  Pt used to work out 3-4x week, cardio, weights. Pt has stopped doing sti ups and crunches since hysterectomy.       Patient Stated Goals  Pt would like to feel good, regain energy level, and live happy, and be in shape again with a stronger core.          [facility] PT Assessment - 12/13/17 0902      Observation/Other Assessments   Observations  moderate cues for thoracolumbar strengthening exercise.  L hallus valgus, B genu valgus       Floor to Stand   Comments  poor alignment at knees                 Pelvic Floor Special Questions - 12/13/17 0900    Prolapse  Posterior Wall   within intoitus   Pelvic Floor Internal Exam  pt consented verbally without contraindications     Exam Type  Vaginal    Palpation  proper breathing cvoordinaiton with pelvic floor,  initally 3/5, following cues 4/5     Strength  good squeeze, good lift, able to hold agaisnt strong resistance                     PT Long Term Goals - 12/13/17 0826      PT LONG TERM GOAL #1   Title  Pt will decrease her COREFO score from 28% to <23 % in order to improve bowel movements and to not strain    Time  12    Period  Weeks    Status  On-going      PT LONG TERM GOAL #2   Title  Pt will decrease her PDI score from 31% to <26 %  in order to improve QOL       Time  8    Period  Weeks    Status  On-going      PT LONG TERM GOAL #3   Title  Pt will report decreased numbness in B arms by 50% and demo decreased upper trap mm tightness in order to progress to deep core coordination for improved function for ADLs    Time  6    Period  Weeks    Status  Achieved      PT LONG TERM GOAL #4   Title  Pt will demo softer abdomen with palpation, improved mobility of deep core in order to progress to stability exercises for improved GI and pelvic floor function     Time  4    Period  Weeks    Status  Achieved      PT LONG TERM GOAL #5   Title  Pt will decrease CLBP by 50% in order to progress towards fitness exercises     Time  6    Period  Weeks    Status  Achieved      Additional Long Term Goals   Additional Long Term Goals  Yes      PT LONG TERM GOAL #6   Title  Pt will demo proper technique and alignment and voice understanding on principles of exercises in order to intergrate to gym setting with less risk for injuries    Time  12    Period  Weeks    Status  Partially Met      PT LONG TERM GOAL #7   Title  Pt will decrease  use of enema from 2 x month to < 1 x month in order to improve QOL    Time  8    Period  Weeks    Status  Achieved      PT LONG TERM GOAL #8   Title  Pt will no experience cramping nor pressure with pelvic pain across one month in order to improve QOL     Time  8    Period  Weeks    Status  New    Target Date  02/07/18            Plan - 12/13/17 0906    Clinical Impression Statement  Pt demo correct coordination with pelvic floor muscles for functional tasks in supine and toileting.  Pt demo'd increased pelvic floor strength following tactile and verabl cues but did not progress pt to strengthening today. Plan to wait for another month and allow for more outer core and thoracolumbar strengthening to take place first.  Noted R hallus vallgus and B genu valgus which need further assessment before progressing to upright/ loaded fitness exercises. Pt continues to benefit from skilled PT.      Rehab Potential  Good    PT Frequency  1x / week    PT Duration  12 weeks    PT Treatment/Interventions  Scar mobilization;Neuromuscular re-education;Balance training;Therapeutic exercise;Therapeutic activities;Manual lymph drainage;Patient/family education;Moist Heat;Traction;Manual techniques;Gait training;Taping;Energy conservation;Functional mobility training;Stair training    Consulted and Agree with Plan of Care  Patient       Patient will benefit from skilled therapeutic intervention in order to improve the following deficits and impairments:  Postural dysfunction, Increased muscle spasms, Hypermobility, Decreased scar mobility, Decreased mobility, Decreased coordination, Decreased endurance, Decreased activity tolerance, Decreased range of motion, Decreased strength, Hypomobility, Decreased safety awareness, Improper body mechanics, Pain, Increased fascial restricitons  Visit Diagnosis: Muscle weakness (generalized)  Other abnormalities of  gait and mobility  Other symptoms and signs  involving the musculoskeletal system  Unspecified lack of coordination     Problem List There are no active problems to display for this patient.   [name]  ,PT, DPT, E-RYT  12/13/2017, 10:42 AM  [place] MAIN [facility] SERVICES [address] [place], [place], [address] Phone: [phone]   Fax:  [phone]  Name: Nancy Paul MRN: [id number] Date of Birth: 02-15-1971

## 2017-12-13 NOTE — Patient Instructions (Addendum)
Bridging series w/ resistive band other side of doorknob:  Level 1:  Position:  Elbows bent, knees hip width apart, heels under knees on top of stable  foot stool   Stabilization points: shoulders, upper arms, back of head pressed into floor. Heel press downward.   Movement: inhale do nothing, exhale pull band by side, lower fists to floor completely while lifting hips.Keep stabilization points engaged when you allow the band to go back to starting position  10 x 2 reps       Level 2:  Position:  Elbows straight, arms raised to ceiling at shoulder height, knees apart like a ballerina,heels together, heels under knees, on top of stable  foot stool   Stabilization points: shoulders, upper arms, back of head pressed into floor. Heel press downward.   Movement: inhale do nothing, exhale pull band by side, lower fists to floor completely while lifting hips. Keep stabilization points engaged when you allow the band to go back to starting position   10 x 2 reps  Shoulder training: Try to imagine you are squeezing a pencil under your armpit and your shoulder blades are down away from your ears and towards each other    _____  Focus on   Practice proper pelvic floor coordination  Inhale: expand pelvic floor muscles Exhale" "j" scoop, allow pelvic floor to close, lift first before belly sinks   ( not "draw abdominal muscle to spine" or strain with abdominal muscles")    _________  Less pelvic pain:  Sidelying position Quadriped with toes tucked and hands shoulders width apart/ or propped on forearms to have pelvic mobility    _______     Transition from standing to floor: Wide squat like you are about to pick something up from the floor --> crawl hand down on thigh and then reach other hand onto the ground (all fours)  To get up, all fours--> lifts hips in to Downward Facing Dog  and walk hands backwards to feet --> mini quat --> hands on thighs, then hips then pause HERE  to  avoid (moving too quickly up/ blood rush) -->  knees glide forward and roll  Hips up instead of hinging spine up   * KEEP YOUR HEAD AND HEART LEVELLED , NEVER LETTING YOUR HEAD GET BELOW YOUR HEART

## 2017-12-14 ENCOUNTER — Encounter: Payer: BLUE CROSS/BLUE SHIELD | Admitting: Physical Therapy

## 2017-12-19 ENCOUNTER — Ambulatory Visit: Payer: BLUE CROSS/BLUE SHIELD | Admitting: Physical Therapy

## 2017-12-22 ENCOUNTER — Encounter: Payer: BLUE CROSS/BLUE SHIELD | Admitting: Physical Therapy

## 2017-12-26 ENCOUNTER — Ambulatory Visit: Payer: BLUE CROSS/BLUE SHIELD | Admitting: Physical Therapy

## 2017-12-28 ENCOUNTER — Encounter: Payer: BLUE CROSS/BLUE SHIELD | Admitting: Physical Therapy

## 2018-01-01 ENCOUNTER — Encounter: Payer: BLUE CROSS/BLUE SHIELD | Admitting: Physical Therapy

## 2018-01-05 ENCOUNTER — Encounter: Payer: BLUE CROSS/BLUE SHIELD | Admitting: Physical Therapy

## 2018-09-24 ENCOUNTER — Ambulatory Visit: Payer: BLUE CROSS/BLUE SHIELD | Admitting: Physical Therapy

## 2018-10-02 ENCOUNTER — Ambulatory Visit: Payer: BC Managed Care – PPO | Admitting: Physical Therapy

## 2018-10-04 ENCOUNTER — Ambulatory Visit: Payer: BC Managed Care – PPO | Attending: Family Medicine | Admitting: Physical Therapy

## 2018-10-04 ENCOUNTER — Other Ambulatory Visit: Payer: Self-pay

## 2018-10-04 ENCOUNTER — Encounter: Payer: Self-pay | Admitting: Physical Therapy

## 2018-10-04 DIAGNOSIS — R2689 Other abnormalities of gait and mobility: Secondary | ICD-10-CM | POA: Diagnosis present

## 2018-10-04 DIAGNOSIS — M533 Sacrococcygeal disorders, not elsewhere classified: Secondary | ICD-10-CM

## 2018-10-04 DIAGNOSIS — M6208 Separation of muscle (nontraumatic), other site: Secondary | ICD-10-CM | POA: Insufficient documentation

## 2018-10-04 NOTE — Therapy (Signed)
[place] [facility] [address] [place], [place], [address] Phone: [phone]   Fax:  [phone]  Physical Therapy Evaluation  Patient Details  Name: Nancy Paul MRN: [id number] Date of Birth: 07-24-1970 No data recorded  Encounter Date: 10/04/2018  PT End of Session - 10/04/18 1352    Visit Number  1    Number of Visits  10    Date for PT Re-Evaluation  12/13/18    PT Start Time  0805    PT Stop Time  0906    PT Time Calculation (min)  61 min    Activity Tolerance  Patient tolerated treatment well;No increased pain    Behavior During Therapy  WFL for tasks assessed/performed       Past Medical History:  Diagnosis Date  . Anxiety     History reviewed. No pertinent surgical history.  There were no vitals filed for this visit.   Subjective Assessment - 10/04/18 0813    Subjective 1) incomplete emptying of bowels: Pt reports when she is having bowel movements, pt needs to still go and someting is still up there. It goes and then it cuts off and she can tell it is not finished like something is holding it up from not coming out al the way. This got worse over the last month or two. Several times when she was wiping and pushing on the anus, she can hear a sound like passing gas coming out of the vagina.  Pt has resorted back to using enema 2-3 x week . 5% of the time, she feels complete with the bowel movement.During and after Pelvic PT last year between March through Oct 2019, pt was able to complete bowel movements daily or every other day  without enemas for 6 months.Because she felt everything was good, pt did not continue with her exercises.  In Dec to March,  pt was out of work due to R foot surgery. Pt has been walking consistently and has been stretching  before and after.  Pt denied blood in her stools, nausea.   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    Pelvic Floor Special Questions - 10/04/18 1431    Diastasis Recti  3 fingers width below umbilicus     External Perineal Exam  increased tightness at L anterior mm > R .  tightness at ischial rami L > R . no presence of fissures        [facility] Adult PT Treatment/Exercise - 10/04/18 1353  Therapeutic Activites    Therapeutic Activities  Other Therapeutic Activities    Other Therapeutic Activities  Discussed proper sitting posture in chair and for working at computer       Manual Therapy   Manual therapy comments  superior glide along R coccygeus/ PA mob at apex of sacrum for more nutation                  PT Long Term Goals - 10/04/18 1441      PT LONG TERM GOAL #1   Title  Pt will increase her score on PSFS for "having a complete bowel movement" from   pts to > 8 pts, having no pain from pts > 8 pts in order to improve QOL    Time  10    Period  Weeks    Status  New    Target Date  12/13/18      PT LONG TERM GOAL #2   Title  Pt will demo no R deviated coccyx , tenderness along R SIJ across 2 visits in order to eliminate bowel movements completely    Time  4    Period  Weeks    Status  New    Target Date  11/01/18      PT LONG TERM GOAL #3   Title  Pt will demo decreased abdominal separation from 3 fingers  width below umbilicus to  < 1 fingers in order to promote motility and less pain    Time  8    Period  Weeks    Status  New    Target Date  11/29/18      PT LONG TERM GOAL #4   Title  Pt will demo proper sitting without cues and report compliances with proper workstation at home to avoid poor sitting position in order minimize relapse of Sx    Time  2    Period  Weeks    Status  New    Target Date  10/18/18             Plan - 10/04/18 1438    Clinical Impression Statement   Pt is a 48 yo female who reports difficulty with completely emptying bowel movements and urine. Pt has completed Pelvic PT last year and had resolution of these issues however, pt did not continue with her exercises once feeling better. Upon assessment today, pt demo'd R deviated coccyx, diastasis recti below umbilicus, poor posture, increased pelvic floor tightness L anteriorly and posteriorly. Post Tx: pt's coccyx was no longer deviated, absence of tenderness at coccyx/ R SIJ area, and decreased pelvic floor tightness. A contributing factor to her R coccyx deviation can be related to the fact that pt wore a boot on R foot for 3 months prior to relapse of these Sx. Provided ergonomic education for proper sitting and work station set up to minimize pressure / loading on her tailbone. Pt voiced understanding.    Rehab Potential  Good    PT Frequency  1x / week    PT Duration  12 weeks    PT Treatment/Interventions  Scar mobilization;Neuromuscular re-education;Balance training;Therapeutic exercise;Therapeutic activities;Manual lymph drainage;Patient/family education;Moist Heat;Traction;Manual techniques;Gait training;Taping;Energy conservation;Functional mobility training;Stair training    Consulted and Agree with Plan of Care  Patient       Patient will benefit from skilled therapeutic intervention in order to improve the following deficits and impairments:  Postural dysfunction, Increased muscle spasms,  Hypermobility, Decreased scar mobility, Decreased mobility, Decreased coordination,  Decreased endurance, Decreased activity tolerance, Decreased range of motion, Decreased strength, Hypomobility, Decreased safety awareness, Improper body mechanics, Pain, Increased fascial restricitons  Visit Diagnosis: 1. Sacrococcygeal disorders, not elsewhere classified   2. Diastasis recti   3. Other abnormalities of gait and mobility        Problem List There are no active problems to display for this patient.   Mariane MastersYeung,Shin Yiing ,PT, DPT, E-RYT  10/04/2018, 2:49 PM   [facility] [address] [place], [place], [address] Phone: [phone]   Fax:  [phone]  Name: Nancy Paul MRN: [id number] Date of Birth: December 22, 1970

## 2018-10-04 NOTE — Patient Instructions (Signed)
   Frog stretch: laying on belly with pillow under hips, knees bent, inhale do nothing, exhale let ankles fall apart   10 reps    Childs poses rocking 10 reps   Wide-knee childs pose rocking  10 reps    Return to deep core level 1 and 2     Proper sitting with feet on the ground  Ergonomic for work station at home, -avoid using laptop on bed ( see handout)

## 2018-10-08 ENCOUNTER — Ambulatory Visit: Payer: BC Managed Care – PPO | Admitting: Physical Therapy

## 2018-10-18 ENCOUNTER — Other Ambulatory Visit: Payer: Self-pay

## 2018-10-18 ENCOUNTER — Ambulatory Visit: Payer: BC Managed Care – PPO | Attending: Family Medicine | Admitting: Physical Therapy

## 2018-10-18 DIAGNOSIS — M6208 Separation of muscle (nontraumatic), other site: Secondary | ICD-10-CM | POA: Diagnosis present

## 2018-10-18 DIAGNOSIS — R29898 Other symptoms and signs involving the musculoskeletal system: Secondary | ICD-10-CM | POA: Diagnosis present

## 2018-10-18 DIAGNOSIS — M533 Sacrococcygeal disorders, not elsewhere classified: Secondary | ICD-10-CM

## 2018-10-18 DIAGNOSIS — R2689 Other abnormalities of gait and mobility: Secondary | ICD-10-CM | POA: Diagnosis present

## 2018-10-18 DIAGNOSIS — R279 Unspecified lack of coordination: Secondary | ICD-10-CM | POA: Insufficient documentation

## 2018-10-18 DIAGNOSIS — M6281 Muscle weakness (generalized): Secondary | ICD-10-CM | POA: Insufficient documentation

## 2018-10-18 NOTE — Therapy (Signed)
[place] MAIN [facility] SERVICES [address] [place], [place], [address] Phone: [phone]   Fax:  [phone]  Physical Therapy Treatment  Patient Details  Name: Nancy Paul MRN: [id number] Date of Birth: [birth year] No data recorded  Encounter Date: 10/18/2018  PT End of Session - 10/18/18 1457    Visit Number  2    Number of Visits  10    Date for PT Re-Evaluation  12/13/18    PT Start Time  1300    PT Stop Time  5102    PT Time Calculation (min)  55 min    Activity Tolerance  Patient tolerated treatment well;No increased pain    Behavior During Therapy  WFL for tasks assessed/performed       Past Medical History:  Diagnosis Date  . Anxiety     No past surgical history on file.  There were no vitals filed for this visit.  Subjective Assessment - 10/18/18 1310    Subjective  Pt reports she went to get a physical exam and the doctor informed her about Grade 2 rectocele, Grade 1 cystocele.  Over the weekend, pt had to push with her hand to have a bowel movement. Pt noticed a lump at the back of the vagina. Pt felt stressed about the possibility of surgery which her doctor explained may be needed. Pt will be seeing a specialist         [facility] PT Assessment - 10/18/18 1451      Observation/Other Assessments   Observations  hyperextension of knees, genu valgus, R medial collapse > L       Palpation   SI assessment   no deviation to coccyx , no tenderness at L SIJ, more equal alignment     Palpation comment  fascial tightens at extensor retinulum 1st ray near scar. slight swelling by scar                 Pelvic Floor Special Questions - 10/18/18 1451    External Perineal Exam  increased tightenss at superficial transverse / ischial rami B         [facility] Adult PT Treatment/Exercise - 10/18/18 1453      Therapeutic Activites    Other Therapeutic Activities  motivational interviewing for compliance with HEP,  explanation on functional activities modification to minimize straining pelvic floor       Neuro Re-ed    Neuro Re-ed Details   cued for body mechanics against gravity, cued for feet propioception to improve DF/ EV in sit to stand / ankle stability in PF seated B       Manual Therapy   Manual therapy comments  STM at ischial rami B .  fadscial tightenss on extensor retinulum 1st ray near scar to increase more plantar arch co-activation                   PT Long Term Goals - 10/04/18 1441      PT LONG TERM GOAL #1   Title  Pt will increase her score on PSFS for "having a complete bowel movement" from   pts to > 8 pts, having no pain from pts > 8 pts in order to improve QOL    Time  10    Period  Weeks    Status  New    Target Date  12/13/18      PT LONG TERM GOAL #2   Title  Pt will demo  no R deviated coccyx , tenderness along R SIJ across 2 visits in order to eliminate bowel movements completely    Time  4    Period  Weeks    Status  New    Target Date  11/01/18      PT LONG TERM GOAL #3   Title  Pt will demo decreased abdominal separation from 3 fingers width below umbilicus to  < 1 fingers in order to promote motility and less pain    Time  8    Period  Weeks    Status  New    Target Date  11/29/18      PT LONG TERM GOAL #4   Title  Pt will demo proper sitting without cues and report compliances with proper workstation at home to avoid poor sitting position in order minimize relapse of Sx    Time  2    Period  Weeks    Status  New    Target Date  10/18/18            Plan - 10/18/18 1415    Clinical Impression Statement  Pt    Rehab Potential  Good    PT Frequency  1x / week    PT Duration  12 weeks    PT Treatment/Interventions  Scar mobilization;Neuromuscular re-education;Balance training;Therapeutic exercise;Therapeutic activities;Manual lymph drainage;Patient/family education;Moist Heat;Traction;Manual techniques;Gait training;Taping;Energy  conservation;Functional mobility training;Stair training    Consulted and Agree with Plan of Care  Patient       Patient will benefit from skilled therapeutic intervention in order to improve the following deficits and impairments:  Postural dysfunction, Increased muscle spasms, Hypermobility, Decreased scar mobility, Decreased mobility, Decreased coordination, Decreased endurance, Decreased activity tolerance, Decreased range of motion, Decreased strength, Hypomobility, Decreased safety awareness, Improper body mechanics, Pain, Increased fascial restricitons  Visit Diagnosis: 1. Sacrococcygeal disorders, not elsewhere classified   2. Diastasis recti   3. Other abnormalities of gait and mobility   4. Muscle weakness (generalized)   5. Other symptoms and signs involving the musculoskeletal system   6. Unspecified lack of coordination        Problem List There are no active problems to display for this patient.   Mariane MastersYeung,Shin Yiing 10/18/2018, 2:58 PM  [facility] MAIN [facility] [address] , [place], [address] Phone: [phone]   Fax:  [phone]  Name: Nancy Paul MRN: [id number] Date of Birth: Jun 29, 1970

## 2018-10-18 NOTE — Patient Instructions (Addendum)
  Proper body mechanics with getting out of a chair to decrease strain  on back &pelvic floor   Avoid holding your breath when Getting out of the chair:  Scoot to front part of chair chair Heels behind feet, feet are hip width apart, nose over toes  Inhale like you are smelling roses Exhale to stand    ___  Bending with mini squat when get things out of cabinet   ___  Deep core level 1 with quick squeeze with pilow udner hips  Deep core level 2 6 min     Pelvic floor strengthening -  On hands and knees, toes tucked, on forearms  on bed or on a chair  5-10 reps   3 x day    ___  Ergonomic handout

## 2018-10-23 ENCOUNTER — Ambulatory Visit: Payer: BC Managed Care – PPO | Admitting: Physical Therapy

## 2018-10-26 ENCOUNTER — Ambulatory Visit: Payer: BC Managed Care – PPO | Admitting: Physical Therapy

## 2018-11-01 ENCOUNTER — Ambulatory Visit: Payer: BC Managed Care – PPO | Admitting: Physical Therapy

## 2018-11-05 ENCOUNTER — Telehealth: Payer: Self-pay | Admitting: Physical Therapy

## 2018-11-05 NOTE — Telephone Encounter (Signed)
DPT left a voicemail message to f/u with as pt left a message with the front desk that her doctor has requested her to discontinue with Pelvic PT at this time. DPT wanted to f/u on whether pt had questions about the deep core level 1 and 2 exercises which are gentle and can help with restoring coordination and fascial tensigrity to the abdominopelvic area. DPT provided encouragement and support on message. Provided DPT email if pt has any further questions.

## 2018-12-22 ENCOUNTER — Ambulatory Visit
Admission: EM | Admit: 2018-12-22 | Discharge: 2018-12-22 | Disposition: A | Discharge status: Home / Self Care | Payer: BC Managed Care – PPO | Attending: Family Medicine | Admitting: Family Medicine

## 2018-12-22 ENCOUNTER — Other Ambulatory Visit: Payer: Self-pay

## 2018-12-22 ENCOUNTER — Ambulatory Visit: Payer: BC Managed Care – PPO

## 2018-12-22 ENCOUNTER — Ambulatory Visit (INDEPENDENT_AMBULATORY_CARE_PROVIDER_SITE_OTHER): Payer: BC Managed Care – PPO

## 2018-12-22 ENCOUNTER — Encounter: Payer: Self-pay | Admitting: Emergency Medicine

## 2018-12-22 DIAGNOSIS — S60222A Contusion of left hand, initial encounter: Secondary | ICD-10-CM | POA: Diagnosis not present

## 2018-12-22 DIAGNOSIS — M542 Cervicalgia: Secondary | ICD-10-CM | POA: Diagnosis not present

## 2018-12-22 DIAGNOSIS — S40011A Contusion of right shoulder, initial encounter: Secondary | ICD-10-CM

## 2018-12-22 DIAGNOSIS — M25511 Pain in right shoulder: Secondary | ICD-10-CM

## 2018-12-22 DIAGNOSIS — S39012A Strain of muscle, fascia and tendon of lower back, initial encounter: Secondary | ICD-10-CM

## 2018-12-22 DIAGNOSIS — M79642 Pain in left hand: Secondary | ICD-10-CM | POA: Diagnosis not present

## 2018-12-22 DIAGNOSIS — M545 Low back pain: Secondary | ICD-10-CM

## 2018-12-22 DIAGNOSIS — S161XXA Strain of muscle, fascia and tendon at neck level, initial encounter: Secondary | ICD-10-CM | POA: Diagnosis not present

## 2018-12-22 MED ORDER — CYCLOBENZAPRINE HCL 10 MG PO TABS: 10.0000 mg | ORAL_TABLET | Freq: Every day | ORAL | 0 refills | Status: AC

## 2018-12-22 MED ORDER — HYDROCODONE-ACETAMINOPHEN 5-325 MG PO TABS: ORAL_TABLET | ORAL | 0 refills | Status: AC

## 2018-12-22 NOTE — ED Triage Notes (Signed)
Patient c/o bilateral wrist and hand pain.  Patient also reports lower back pain and headache.

## 2018-12-22 NOTE — Discharge Instructions (Signed)
Rest, ice/heat, over the counter tylenol/ibuprofen as needed °
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
